# Patient Record
Sex: Male | Born: 1955 | Race: White | Hispanic: No | Marital: Married | Smoking: Former smoker
Health system: Southern US, Community
[De-identification: ages and names within clinical notes are randomized; demographics above are authoritative.]

## PROBLEM LIST (undated history)

## (undated) DIAGNOSIS — M1990 Unspecified osteoarthritis, unspecified site: Secondary | ICD-10-CM

## (undated) HISTORY — DX: Unspecified osteoarthritis, unspecified site: M19.90

---

## 2013-06-11 LAB — HEPATIC FUNCTION PANEL
ALK PHOS: 84 U/L (ref 25–125)
ALT: 25 U/L (ref 10–40)
AST: 30 U/L (ref 14–40)
BILIRUBIN DIRECT: 0.1 mg/dL
Bilirubin, Total: 0.6 mg/dL

## 2013-06-11 LAB — TSH: TSH: 1.03 u[IU]/mL (ref <=5.90)

## 2013-06-11 LAB — BASIC METABOLIC PANEL
BUN: 12 mg/dL (ref 4–21)
CREATININE: 0.9 mg/dL (ref <=1.3)
Potassium: 4.4 mmol/L (ref 3.4–5.3)
SODIUM: 140 mmol/L (ref 137–147)

## 2013-06-11 LAB — CBC AND DIFFERENTIAL
HEMATOCRIT: 47 % (ref 41–53)
Hemoglobin: 16.2 g/dL (ref 13.5–17.5)
Platelets: 257 10*3/uL (ref 150–399)
WBC: 5.8 10^3/mL

## 2013-09-30 LAB — HM COLONOSCOPY

## 2014-10-08 ENCOUNTER — Encounter: Payer: Self-pay | Admitting: Internal Medicine

## 2014-10-08 ENCOUNTER — Ambulatory Visit (INDEPENDENT_AMBULATORY_CARE_PROVIDER_SITE_OTHER): Payer: 59 | Admitting: Family Medicine

## 2014-10-08 VITALS — BP 124/79 | HR 51 | Temp 98.1°F | Resp 16 | Ht 72.8 in | Wt 184.4 lb

## 2014-10-08 DIAGNOSIS — Z0000 Encounter for general adult medical examination without abnormal findings: Secondary | ICD-10-CM

## 2014-10-08 DIAGNOSIS — Z13220 Encounter for screening for lipoid disorders: Secondary | ICD-10-CM | POA: Diagnosis not present

## 2014-10-08 DIAGNOSIS — Z130 Encounter for screening for diseases of the blood and blood-forming organs and certain disorders involving the immune mechanism: Secondary | ICD-10-CM | POA: Diagnosis not present

## 2014-10-08 DIAGNOSIS — Z125 Encounter for screening for malignant neoplasm of prostate: Secondary | ICD-10-CM | POA: Diagnosis not present

## 2014-10-08 DIAGNOSIS — Z23 Encounter for immunization: Secondary | ICD-10-CM

## 2014-10-08 LAB — LIPID PANEL
CHOLESTEROL: 194 mg/dL (ref 0–200)
HDL: 46 mg/dL (ref >=40)
LDL CALC: 119 mg/dL — AB (ref 0–99)
Total CHOL/HDL Ratio: 4.2 Ratio
Triglycerides: 147 mg/dL (ref <150)
VLDL: 29 mg/dL (ref 0–40)

## 2014-10-08 LAB — CBC
HEMATOCRIT: 48.5 % (ref 39.0–52.0)
Hemoglobin: 16.7 g/dL (ref 13.0–17.0)
MCH: 31.5 pg (ref 26.0–34.0)
MCHC: 34.4 g/dL (ref 30.0–36.0)
MCV: 91.3 fL (ref 78.0–100.0)
MPV: 9.4 fL (ref 8.6–12.4)
PLATELETS: 274 10*3/uL (ref 150–400)
RBC: 5.31 MIL/uL (ref 4.22–5.81)
RDW: 13.2 % (ref 11.5–15.5)
WBC: 8.3 10*3/uL (ref 4.0–10.5)

## 2014-10-08 LAB — COMPREHENSIVE METABOLIC PANEL
ALK PHOS: 73 U/L (ref 39–117)
ALT: 22 U/L (ref 0–53)
AST: 24 U/L (ref 0–37)
Albumin: 4.2 g/dL (ref 3.5–5.2)
BUN: 16 mg/dL (ref 6–23)
CALCIUM: 9.3 mg/dL (ref 8.4–10.5)
CHLORIDE: 105 meq/L (ref 96–112)
CO2: 22 mEq/L (ref 19–32)
CREATININE: 0.83 mg/dL (ref 0.50–1.35)
Glucose, Bld: 88 mg/dL (ref 70–99)
Potassium: 4.7 mEq/L (ref 3.5–5.3)
Sodium: 139 mEq/L (ref 135–145)
TOTAL PROTEIN: 6.9 g/dL (ref 6.0–8.3)
Total Bilirubin: 0.7 mg/dL (ref 0.2–1.2)

## 2014-10-08 NOTE — Progress Notes (Signed)
[facility] [address] [place]  [phone]  Date:  10/08/2014   Name:  Jose Hobbs   DOB:  1955/11/07   MRN:  [id number]  PCP:  No primary care provider on file.    Chief Complaint: Annual Exam   History of Present Illness:  Jose Hobbs is a 59 y.o. very pleasant male patient who presents with the following:  Here today for a wellness exam.  Last colon in 2009- was normal He likes to exercise outdoors a good bit.   He is not sure about his last tetanus shot but thinks that he is due  There are no active problems to display for this patient.   Past Medical History  Diagnosis Date  . Arthritis     History reviewed. No pertinent past surgical history.  History  Substance Use Topics  . Smoking status: Never Smoker   . Smokeless tobacco: Not on file  . Alcohol Use: No    History reviewed. No pertinent family history.  No Known Allergies  Medication list has been reviewed and updated.  No current outpatient prescriptions on file prior to visit.   No current facility-administered medications on file prior to visit.    Review of Systems:  As per HPI- otherwise negative. 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 Ideal Body Weight: Weight in (lb) to have BMI = 25: 188.1  GEN: WDWN, NAD, Non-toxic, A & O x 3 HEENT: Atraumatic, Normocephalic. Neck supple. No masses, No LAD.  Bilateral TM wnl, oropharynx normal.  PEERL,EOMI.    Ears and Nose: No external deformity. CV: RRR, No M/G/R. No JVD. No thrill. No extra heart sounds. PULM: CTA B, no wheezes, crackles, rhonchi.  No retractions. No resp. distress. No accessory muscle use. ABD: S, NT, ND. No rebound. No HSM. EXTR: No c/c/e NEURO Normal gait.  PSYCH: Normally interactive. Conversant. Not depressed or anxious appearing.  Calm demeanor.  GU: normal testes, penis and prostate  Assessment and Plan: Immunization due - Plan: Tdap vaccine greater than or equal to 7yo IM  Screening for hyperlipidemia - Plan: Lipid panel  Screening for deficiency anemia - Plan: CBC  Screening for prostate cancer - Plan: PSA  Physical exam - Plan: Comprehensive metabolic panel  tdap today, declines flu shot.  Await labs- Will plan further follow- up pending labs. See patient instructions for more details.    Send rx for zostavax along with labs as I forgot to give this to him today after discussion   Signed Lamar Blinks, MD

## 2014-10-08 NOTE — Patient Instructions (Signed)
Good to see you today- I will be in touch with your labs Continue to exercise and take care of yourself You will be due for a colonoscopy in 2019 You had your Tdap today Be careful of the sun.  If you would like to see ENT about your tinnitus please let me know.   You can also see audiology regarding low hearing if you like; you should not need a referral to see audiology   Td Vaccine (Tetanus and Diphtheria): What You Need to Know 1. Why get vaccinated? Tetanus  and diphtheria are very serious diseases. They are rare in the [place] today, but people who do become infected often have severe complications. Td vaccine is used to protect adolescents and adults from both of these diseases. Both tetanus and diphtheria are infections caused by bacteria. Diphtheria spreads from person to person through coughing or sneezing. Tetanus-causing bacteria enter the body through cuts, scratches, or wounds. TETANUS (Lockjaw) causes painful muscle tightening and stiffness, usually all over the body.  It can lead to tightening of muscles in the head and neck so you can't open your mouth, swallow, or sometimes even breathe. Tetanus kills about 1 out of every 5 people who are infected. DIPHTHERIA can cause a thick coating to form in the back of the throat.  It can lead to breathing problems, paralysis, heart failure, and death. Before vaccines, the [place] saw as many as 200,000 cases a year of diphtheria and hundreds of cases of tetanus. Since vaccination began, cases of both diseases have dropped by about 99%. 2. Td vaccine Td vaccine can protect adolescents and adults from tetanus and diphtheria. Td is usually given as a booster dose every 10 years but it can also be given earlier after a severe and dirty wound or burn. Your doctor can give you more information. Td may safely be given at the same time as other vaccines. 3. Some people should not get this vaccine  If you ever had a life-threatening  allergic reaction after a dose of any tetanus or diphtheria containing vaccine, OR if you have a severe allergy to any part of this vaccine, you should not get Td. Tell your doctor if you have any severe allergies.  Talk to your doctor if you:  have epilepsy or another nervous system problem,  had severe pain or swelling after any vaccine containing diphtheria or tetanus,  ever had Guillain Barr Syndrome (GBS),  aren't feeling well on the day the shot is scheduled. 4. Risks of a vaccine reaction With a vaccine, like any medicine, there is a chance of side effects. These are usually mild and go away on their own. Serious side effects are also possible, but are very rare. Most people who get Td vaccine do not have any problems with it. Mild Problems  following Td (Did not interfere with activities)  Pain where the shot was given (about 8 people in 10)  Redness or swelling where the shot was given (about 1 person in 3)  Mild fever (about 1 person in 15)  Headache or Tiredness (uncommon) Moderate Problems following Td (Interfered with activities, but did not require medical attention)  Fever over 102F (rare) Severe Problems  following Td (Unable to perform usual activities; required medical attention)  Swelling, severe pain, bleeding and/or redness in the arm where the shot was given (rare). Problems that could happen after any vaccine:  Brief fainting spells can happen after any medical procedure, including vaccination. Sitting or lying  down for about 15 minutes can help prevent fainting, and injuries caused by a fall. Tell your doctor if you feel dizzy, or have vision changes or ringing in the ears.  Severe shoulder pain and reduced range of motion in the arm where a shot was given can happen, very rarely, after a vaccination.  Severe allergic reactions from a vaccine are very rare, estimated at less than 1 in a million doses. If one were to occur, it would usually be within a  few minutes to a few hours after the vaccination. 5. What if there is a serious reaction? What should I look for?  Look for anything that concerns you, such as signs of a severe allergic reaction, very high fever, or behavior changes. Signs of a severe allergic reaction can include hives, swelling of the face and throat, difficulty breathing, a fast heartbeat, dizziness, and weakness. These would usually start a few minutes to a few hours after the vaccination. What should I do?  If you think it is a severe allergic reaction or other emergency that can't wait, call 9-1-1 or get the person to the nearest hospital. Otherwise, call your doctor.  Afterward, the reaction should be reported to the Vaccine Adverse Event Reporting System (VAERS). Your doctor might file this report, or you can do it yourself through the VAERS web site at www.vaers.SamedayNews.es, or by calling 272-189-5965. VAERS is only for reporting reactions. They do not give medical advice. 6. The National Vaccine Injury Compensation Program The [employer] Vaccine Injury Compensation Program (VICP) is a federal program that was created to compensate people who may have been injured by certain vaccines. Persons who believe they may have been injured by a vaccine can learn about the program and about filing a claim by calling 8123759101 or visiting the [place] website at GoldCloset.com.ee. 7. How can I learn more?  Ask your doctor.  Contact your local or state health department.  Contact the Centers for Disease Control and Prevention (CDC):  Call 408-704-2130 (1-800-CDC-INFO)  Visit CDC's website at http://hunter.com/ CDC Td Vaccine Interim VIS (08/14/12) Document Released: 04/24/2006 Document Revised: 11/11/2013 Document Reviewed: 10/09/2013 [facility] Patient Information 2015 [place], [place]. This information is not intended to replace advice given to you by your health care provider. Make sure you discuss any  questions you have with your health care provider.

## 2014-10-08 NOTE — Progress Notes (Signed)
   Subjective:    Patient ID: Jose Hobbs, male    DOB: [birth year], 59 y.o.   MRN: [id number]  HPI    Review of Systems  Constitutional: Negative.   HENT: Positive for tinnitus.   Eyes: Negative.   Respiratory: Negative.   Cardiovascular: Negative.   Gastrointestinal: Negative.   Endocrine: Negative.   Genitourinary: Negative.   Musculoskeletal: Positive for arthralgias.  Allergic/Immunologic: Negative.   Neurological: Negative.   Hematological: Negative.   Psychiatric/Behavioral: Negative.        Objective:   Physical Exam        Assessment & Plan:

## 2014-10-09 ENCOUNTER — Encounter: Payer: Self-pay | Admitting: Family Medicine

## 2014-10-09 LAB — PSA: PSA: 0.91 ng/mL (ref <=4.00)

## 2015-06-23 ENCOUNTER — Telehealth: Payer: Self-pay

## 2015-06-23 DIAGNOSIS — D229 Melanocytic nevi, unspecified: Secondary | ICD-10-CM

## 2015-06-23 NOTE — Telephone Encounter (Signed)
Joseph Art states her husband have an appt with Dr Wilhemina Bonito with [facility] on [address] for Thursday and they need a referral. Please call [phone] when done

## 2015-06-23 NOTE — Telephone Encounter (Signed)
[facility] referral. I need diagnosis. Left message for pt to call back.

## 2015-06-23 NOTE — Telephone Encounter (Signed)
Wife called and stated he has a mole on his back and a new one on his chest. Does he need to be seen first?

## 2016-01-21 ENCOUNTER — Encounter: Payer: Self-pay | Admitting: Family Medicine

## 2016-01-21 ENCOUNTER — Ambulatory Visit (INDEPENDENT_AMBULATORY_CARE_PROVIDER_SITE_OTHER): Payer: BLUE CROSS/BLUE SHIELD | Admitting: Family Medicine

## 2016-01-21 DIAGNOSIS — M549 Dorsalgia, unspecified: Secondary | ICD-10-CM

## 2016-01-21 DIAGNOSIS — Z87891 Personal history of nicotine dependence: Secondary | ICD-10-CM

## 2016-01-21 DIAGNOSIS — M1990 Unspecified osteoarthritis, unspecified site: Secondary | ICD-10-CM

## 2016-01-21 DIAGNOSIS — R413 Other amnesia: Secondary | ICD-10-CM | POA: Diagnosis not present

## 2016-01-21 DIAGNOSIS — R5383 Other fatigue: Secondary | ICD-10-CM

## 2016-01-21 DIAGNOSIS — Z8042 Family history of malignant neoplasm of prostate: Secondary | ICD-10-CM | POA: Insufficient documentation

## 2016-01-21 DIAGNOSIS — F17290 Nicotine dependence, other tobacco product, uncomplicated: Secondary | ICD-10-CM | POA: Insufficient documentation

## 2016-01-21 DIAGNOSIS — G8929 Other chronic pain: Secondary | ICD-10-CM

## 2016-01-21 DIAGNOSIS — Z720 Tobacco use: Secondary | ICD-10-CM | POA: Diagnosis not present

## 2016-01-21 NOTE — Progress Notes (Signed)
Marjory Sneddon, D.O. Primary care at [facility]:    Chief Complaint  Patient presents with  . Establish Care   New pt, here to establish care.   HPI: Jose Hobbs is a pleasant 60 y.o. male who presents to [place] [facility] today to establish care.    Patient has never had a primary care physician. 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 He is married and has no children.  Bothersome to patient that his father had metastatic prostate cancer. Both parents suffer from depression and high blood pressure. Father had a stroke and suffered memory problems b/c of that.  Pt worried about his own memory.    Has h/o of chronic back pain. He's had 6 or 7 flareups since his 30s of back pain. He has never sought any professional help through physical therapy or other.  - stable and esentially does his ADLs.  Occsnal flares.  takes no medications. Would like to know exercses he can do to improve pain / prevent further flares etc.   smokes cigars. Would like to eventually stop. Patient started smoking when he was 60 years old and smoked 1 pack of cigarettes a day until 2013. He quit cigarettes but then picked up cigars and smokes at least 2-3 per day. He also chews Nicorette gum approximately 3 pieces per day.    Past Medical History  Diagnosis Date  . Arthritis     History reviewed. No pertinent past surgical history.   Family History  Problem Relation Age of Onset  . Depression Mother   . Hypertension Mother   . Cancer Father     metastatic prostate  . Depression Father   . Hypertension Father   . Stroke Father   . Healthy Brother     History  Drug Use No     History  Alcohol Use No     History  Smoking status  . Former Smoker  . Quit date: 07/12/2011  Smokeless tobacco  . Never Used      History  Sexual Activity  . Sexual Activity: Yes  . Birth Control/ Protection: None      Patient's Medications   No medications on file     Review of patient's allergies indicates no known allergies.   Immunization History  Administered Date(s) Administered  . Tdap 10/08/2014    Review of Systems:   ( Completed via Adult Medical History Intake form today ) General:   Denies fever, chills, appetite changes, unexplained weight loss.  Optho/Auditory:   Denies visual changes, blurred vision/LOV, ringing in ears/ diff hearing Respiratory:   Denies SOB, DOE, cough, wheezing.  Cardiovascular:   Denies chest pain, palpitations, new onset peripheral edema  Gastrointestinal:   Denies nausea, vomiting, diarrhea.  Genitourinary:    Denies dysuria, increased frequency, flank pain.  Endocrine:     Denies hot or cold intolerance, polyuria, polydipsia. Musculoskeletal:  Denies unexplained myalgias, joint swelling, arthralgias, gait problems.  Skin:  Denies rash, suspicious lesions or new/ changes in moles Neurological:    Denies dizziness, syncope, unexplained weakness, lightheadedness, numbness  Psychiatric/Behavioral:   Denies mood changes, suicidal or homicidal ideations, hallucinations    Objective:   Blood pressure 131/81, pulse 54, height 5' 11.5" (1.816 m), weight 184 lb 6.4 oz (83.643 kg). Body mass index is 25.36 kg/(m^2).  General: Well Developed, well nourished, and in no acute distress.  Neuro: Alert and oriented  x3, extra-ocular muscles intact, sensation grossly intact.  HEENT: Normocephalic, atraumatic, pupils equal round reactive to light, neck supple, no gross masses, no carotid bruits, no JVD apprec Skin: no gross suspicious lesions or rashes  Cardiac: Regular rate and rhythm, no murmurs rubs or gallops.  Respiratory: inc exh phase and distant but essentially clear to auscultation bilaterally. Not using accessory muscles, speaking in full sentences.  Abdominal: Soft, not grossly distended Musculoskeletal: Ambulates w/o diff, FROM * 4 ext.  Vasc: less 2 sec cap RF, warm and pink  Psych:   No HI/SI, judgement and insight good.    Impression and Recommendations:    1. Chronic back pain greater than 3 months duration   2. History of tobacco abuse   3. Cigar smoker   4. fatigue- generalized.   5. Family history of prostate cancer in father   61. Pt with concerns of Memory dysfunction   7. Arthritis- lower back   -Patient declined physical therapy referral. Take NSAIDs at first onset when patient starts to hurt. Gave him handouts and showed him in the office had to do some stretches. Core strengthening his key and I recommend planks daily. Handouts provided.  -Patient not ready to quit smoking yet but would like to consider in the future.   -we will obtain labs, then address issues as they present. Pt understands need to become healthier and cannot ignore it any longer  The patient was counseled, risk factors were discussed, anticipatory guidance given.  Please see AVS handed out to patient at the end of our visit for further patient instructions/ counseling done pertaining to today's office visit.    Orders Placed This Encounter  Procedures  . CBC with Differential/Platelet  . COMPLETE METABOLIC PANEL WITH GFR  . Hemoglobin A1c  . Lipid panel    Order Specific Question:  Has the patient fasted?    Answer:  Yes  . TSH  . VITAMIN D 25 Hydroxy (Vit-D Deficiency, Fractures)  . T3, free  . T4, free  . Vitamin B12  . PSA   Note: This document was prepared using Dragon voice recognition software and may include unintentional dictation errors.

## 2016-01-21 NOTE — Patient Instructions (Signed)
Do planks daily-    Back Exercises The following exercises strengthen the muscles that help to support the back. They also help to keep the lower back flexible. Doing these exercises can help to prevent back pain or lessen existing pain. If you have back pain or discomfort, try doing these exercises 2-3 times each day or as told by your health care provider. When the pain goes away, do them once each day, but increase the number of times that you repeat the steps for each exercise (do more repetitions). If you do not have back pain or discomfort, do these exercises once each day or as told by your health care provider. EXERCISES Single Knee to Chest Repeat these steps 3-5 times for each leg: 1. Lie on your back on a firm bed or the floor with your legs extended. 2. Bring one knee to your chest. Your other leg should stay extended and in contact with the floor. 3. Hold your knee in place by grabbing your knee or thigh. 4. Pull on your knee until you feel a gentle stretch in your lower back. 5. Hold the stretch for 10-30 seconds. 6. Slowly release and straighten your leg. Pelvic Tilt Repeat these steps 5-10 times: 1. Lie on your back on a firm bed or the floor with your legs extended. 2. Bend your knees so they are pointing toward the ceiling and your feet are flat on the floor. 3. Tighten your lower abdominal muscles to press your lower back against the floor. This motion will tilt your pelvis so your tailbone points up toward the ceiling instead of pointing to your feet or the floor. 4. With gentle tension and even breathing, hold this position for 5-10 seconds. Cat-Cow Repeat these steps until your lower back becomes more flexible: 1. Get into a hands-and-knees position on a firm surface. Keep your hands under your shoulders, and keep your knees under your hips. You may place padding under your knees for comfort. 2. Let your head hang down, and point your tailbone toward the floor so your  lower back becomes rounded like the back of a cat. 3. Hold this position for 5 seconds. 4. Slowly lift your head and point your tailbone up toward the ceiling so your back forms a sagging arch like the back of a cow. 5. Hold this position for 5 seconds. Press-Ups Repeat these steps 5-10 times: 1. Lie on your abdomen (face-down) on the floor. 2. Place your palms near your head, about shoulder-width apart. 3. While you keep your back as relaxed as possible and keep your hips on the floor, slowly straighten your arms to raise the top half of your body and lift your shoulders. Do not use your back muscles to raise your upper torso. You may adjust the placement of your hands to make yourself more comfortable. 4. Hold this position for 5 seconds while you keep your back relaxed. 5. Slowly return to lying flat on the floor. Bridges Repeat these steps 10 times: 1. Lie on your back on a firm surface. 2. Bend your knees so they are pointing toward the ceiling and your feet are flat on the floor. 3. Tighten your buttocks muscles and lift your buttocks off of the floor until your waist is at almost the same height as your knees. You should feel the muscles working in your buttocks and the back of your thighs. If you do not feel these muscles, slide your feet 1-2 inches farther away from your buttocks.  4. Hold this position for 3-5 seconds. 5. Slowly lower your hips to the starting position, and allow your buttocks muscles to relax completely. If this exercise is too easy, try doing it with your arms crossed over your chest. Abdominal Crunches Repeat these steps 5-10 times: 1. Lie on your back on a firm bed or the floor with your legs extended. 2. Bend your knees so they are pointing toward the ceiling and your feet are flat on the floor. 3. Cross your arms over your chest. 4. Tip your chin slightly toward your chest without bending your neck. 5. Tighten your abdominal muscles and slowly raise your trunk  (torso) high enough to lift your shoulder blades a tiny bit off of the floor. Avoid raising your torso higher than that, because it can put too much stress on your low back and it does not help to strengthen your abdominal muscles. 6. Slowly return to your starting position. Back Lifts Repeat these steps 5-10 times: 1. Lie on your abdomen (face-down) with your arms at your sides, and rest your forehead on the floor. 2. Tighten the muscles in your legs and your buttocks. 3. Slowly lift your chest off of the floor while you keep your hips pressed to the floor. Keep the back of your head in line with the curve in your back. Your eyes should be looking at the floor. 4. Hold this position for 3-5 seconds. 5. Slowly return to your starting position. SEEK MEDICAL CARE IF:  Your back pain or discomfort gets much worse when you do an exercise.  Your back pain or discomfort does not lessen within 2 hours after you exercise. If you have any of these problems, stop doing these exercises right away. Do not do them again unless your health care provider says that you can. SEEK IMMEDIATE MEDICAL CARE IF:  You develop sudden, severe back pain. If this happens, stop doing the exercises right away. Do not do them again unless your health care provider says that you can.   This information is not intended to replace advice given to you by your health care provider. Make sure you discuss any questions you have with your health care provider.   Document Released: 08/04/2004 Document Revised: 03/18/2015 Document Reviewed: 08/21/2014 Elsevier Interactive Patient Education 2016 [place] Injury Prevention Back injuries can be very painful. They can also be difficult to heal. After having one back injury, you are more likely to injure your back again. It is important to learn how to avoid injuring or re-injuring your back. The following tips can help you to prevent a back injury. WHAT SHOULD I KNOW ABOUT  PHYSICAL FITNESS? 7. Exercise for 30 minutes per day on most days of the week or as directed by your health care provider. Make sure to: 1. Do aerobic exercises, such as walking, jogging, biking, or swimming. 2. Do exercises that increase balance and strength, such as tai chi and yoga. These can decrease your risk of falling and injuring your back. 3. Do stretching exercises to help with flexibility. 4. Try to develop strong abdominal muscles. Your abdominal muscles provide a lot of the support that is needed by your back. 8. Maintain a healthy weight. This helps to decrease your risk of a back injury. WHAT SHOULD I KNOW ABOUT MY DIET? 5. Talk with your health care provider about your overall diet. Take supplements and vitamins only as directed by your health care provider. 6. Talk with your health care provider  about how much calcium and vitamin D you need each day. These nutrients help to prevent weakening of the bones (osteoporosis). Osteoporosis can cause broken (fractured) bones, which lead to back pain. 7. Include good sources of calcium in your diet, such as dairy products, green leafy vegetables, and products that have had calcium added to them (fortified). 8. Include good sources of vitamin D in your diet, such as milk and foods that are fortified with vitamin D. WHAT SHOULD I KNOW ABOUT MY POSTURE? 6. Sit up straight and stand up straight. Avoid leaning forward when you sit or hunching over when you stand. 7. Choose chairs that have good low-back (lumbar) support. 8. If you work at a desk, sit close to it so you do not need to lean over. Keep your chin tucked in. Keep your neck drawn back, and keep your elbows bent at a right angle. Your arms should look like the letter "L." 9. Sit high and close to the steering wheel when you drive. Add a lumbar support to your car seat, if needed. 10. Avoid sitting or standing in one position for very long. Take breaks to get up, stretch, and walk  around at least one time every hour. Take breaks every hour if you are driving for long periods of time. 11. Sleep on your side with your knees slightly bent, or sleep on your back with a pillow under your knees. Do not lie on the front of your body to sleep. WHAT SHOULD I KNOW ABOUT LIFTING, TWISTING, AND REACHING? Lifting and Heavy Lifting 6. Avoid heavy lifting, especially repetitive heavy lifting. If you must do heavy lifting: 1. Stretch before lifting. 2. Work slowly. 3. Rest between lifts. 4. Use a tool such as a cart or a dolly to move objects if one is available. 5. Make several small trips instead of carrying one heavy load. 6. Ask for help when you need it, especially when moving big objects. 7. Follow these steps when lifting: 1. Stand with your feet shoulder-width apart. 2. Get as close to the object as you can. Do not try to pick up a heavy object that is far from your body. 3. Use handles or lifting straps if they are available. 4. Bend at your knees. Squat down, but keep your heels off the floor. 5. Keep your shoulders pulled back, your chin tucked in, and your back straight. 6. Lift the object slowly while you tighten the muscles in your legs, abdomen, and buttocks. Keep the object as close to the center of your body as possible. 8. Follow these steps when putting down a heavy load: 1. Stand with your feet shoulder-width apart. 2. Lower the object slowly while you tighten the muscles in your legs, abdomen, and buttocks. Keep the object as close to the center of your body as possible. 3. Keep your shoulders pulled back, your chin tucked in, and your back straight. 4. Bend at your knees. Squat down, but keep your heels off the floor. 5. Use handles or lifting straps if they are available. Twisting and Reaching 6. Avoid lifting heavy objects above your waist. 7. Do not twist at your waist while you are lifting or carrying a load. If you need to turn, move your feet. 8. Do not  bend over without bending at your knees. 9. Avoid reaching over your head, across a table, or for an object on a high surface. WHAT ARE SOME OTHER TIPS? 7. Avoid wet floors and icy ground. Keep sidewalks  clear of ice to prevent falls. 8. Do not sleep on a mattress that is too soft or too hard. 9. Keep items that are used frequently within easy reach. 10. Put heavier objects on shelves at waist level, and put lighter objects on lower or higher shelves. 11. Find ways to decrease your stress, such as exercise, massage, or relaxation techniques. Stress can build up in your muscles. Tense muscles are more vulnerable to injury. 12. Talk with your health care provider if you feel anxious or depressed. These conditions can make back pain worse. 13. Wear flat heel shoes with cushioned soles. 14. Avoid sudden movements. 15. Use both shoulder straps when carrying a backpack. 16. Do not use any tobacco products, including cigarettes, chewing tobacco, or electronic cigarettes. If you need help quitting, ask your health care provider.   This information is not intended to replace advice given to you by your health care provider. Make sure you discuss any questions you have with your health care provider.   Document Released: 08/04/2004 Document Revised: 11/11/2014 Document Reviewed: 07/01/2014 Elsevier Interactive Patient Education [employer].

## 2016-01-22 LAB — LIPID PANEL
CHOL/HDL RATIO: 3.7 ratio (ref <=5.0)
CHOLESTEROL: 176 mg/dL (ref 125–200)
HDL: 48 mg/dL (ref >=40)
LDL Cholesterol: 105 mg/dL (ref <130)
TRIGLYCERIDES: 113 mg/dL (ref <150)
VLDL: 23 mg/dL (ref <30)

## 2016-01-22 LAB — COMPLETE METABOLIC PANEL WITH GFR
ALT: 17 U/L (ref 9–46)
AST: 22 U/L (ref 10–35)
Albumin: 4.4 g/dL (ref 3.6–5.1)
Alkaline Phosphatase: 82 U/L (ref 40–115)
BUN: 14 mg/dL (ref 7–25)
CHLORIDE: 104 mmol/L (ref 98–110)
CO2: 28 mmol/L (ref 20–31)
Calcium: 9.6 mg/dL (ref 8.6–10.3)
Creat: 0.98 mg/dL (ref 0.70–1.33)
GFR, EST NON AFRICAN AMERICAN: 84 mL/min (ref >=60)
GFR, Est African American: >89 mL/min (ref >=60)
Glucose, Bld: 97 mg/dL (ref 65–99)
POTASSIUM: 5.7 mmol/L — AB (ref 3.5–5.3)
SODIUM: 139 mmol/L (ref 135–146)
Total Bilirubin: 0.6 mg/dL (ref 0.2–1.2)
Total Protein: 6.8 g/dL (ref 6.1–8.1)

## 2016-01-22 LAB — CBC WITH DIFFERENTIAL/PLATELET
Basophils Absolute: 0 cells/uL (ref 0–200)
Basophils Relative: 0 %
EOS PCT: 3 %
Eosinophils Absolute: 207 cells/uL (ref 15–500)
HCT: 48.2 % (ref 38.5–50.0)
Hemoglobin: 16.1 g/dL (ref 13.2–17.1)
LYMPHS ABS: 1863 {cells}/uL (ref 850–3900)
Lymphocytes Relative: 27 %
MCH: 30.9 pg (ref 27.0–33.0)
MCHC: 33.4 g/dL (ref 32.0–36.0)
MCV: 92.5 fL (ref 80.0–100.0)
MPV: 10.3 fL (ref 7.5–12.5)
Monocytes Absolute: 552 cells/uL (ref 200–950)
Monocytes Relative: 8 %
NEUTROS ABS: 4278 {cells}/uL (ref 1500–7800)
NEUTROS PCT: 62 %
PLATELETS: 242 10*3/uL (ref 140–400)
RBC: 5.21 MIL/uL (ref 4.20–5.80)
RDW: 13.1 % (ref 11.0–15.0)
WBC: 6.9 10*3/uL (ref 3.8–10.8)

## 2016-01-22 LAB — PSA: PSA: 1.03 ng/mL (ref <=4.00)

## 2016-01-22 LAB — VITAMIN D 25 HYDROXY (VIT D DEFICIENCY, FRACTURES): Vit D, 25-Hydroxy: 35 ng/mL (ref 30–100)

## 2016-01-22 LAB — T4, FREE: FREE T4: 1.2 ng/dL (ref 0.8–1.8)

## 2016-01-22 LAB — HEMOGLOBIN A1C
Hgb A1c MFr Bld: 5.7 % — ABNORMAL HIGH (ref <5.7)
Mean Plasma Glucose: 117 mg/dL

## 2016-01-22 LAB — T3, FREE: T3, Free: 3.4 pg/mL (ref 2.3–4.2)

## 2016-01-22 LAB — VITAMIN B12: VITAMIN B 12: 569 pg/mL (ref 200–1100)

## 2016-01-22 LAB — TSH: TSH: 0.84 mIU/L (ref 0.40–4.50)

## 2016-01-26 NOTE — Progress Notes (Signed)
Quick Note:  We will discuss these lab results at office visit tomorrow ______

## 2016-01-27 ENCOUNTER — Ambulatory Visit (INDEPENDENT_AMBULATORY_CARE_PROVIDER_SITE_OTHER): Payer: BLUE CROSS/BLUE SHIELD | Admitting: Family Medicine

## 2016-01-27 ENCOUNTER — Encounter: Payer: Self-pay | Admitting: Family Medicine

## 2016-01-27 VITALS — BP 142/80 | HR 61 | Ht 71.5 in | Wt 186.0 lb

## 2016-01-27 DIAGNOSIS — Z720 Tobacco use: Secondary | ICD-10-CM | POA: Diagnosis not present

## 2016-01-27 DIAGNOSIS — Z8042 Family history of malignant neoplasm of prostate: Secondary | ICD-10-CM | POA: Diagnosis not present

## 2016-01-27 DIAGNOSIS — F17290 Nicotine dependence, other tobacco product, uncomplicated: Secondary | ICD-10-CM

## 2016-01-27 DIAGNOSIS — Z87891 Personal history of nicotine dependence: Secondary | ICD-10-CM | POA: Diagnosis not present

## 2016-01-27 DIAGNOSIS — R7303 Prediabetes: Secondary | ICD-10-CM

## 2016-01-27 DIAGNOSIS — M1990 Unspecified osteoarthritis, unspecified site: Secondary | ICD-10-CM | POA: Insufficient documentation

## 2016-01-27 DIAGNOSIS — R5383 Other fatigue: Secondary | ICD-10-CM

## 2016-01-27 NOTE — Assessment & Plan Note (Addendum)
  The patient was counseled on the dangers of tobacco/cigar use, and was advised to quit, esp due to his long h/o cig smoking as well.   Reviewed strategies to maximize success, including removing cigarettes and smoking materials from environment, stress management, substitution of other forms of reinforcement and pharmacotherapy (wellbutrin, chatix, patches/ gums).  Patient wishes to do it on his own. Doesn't think he needs any supplements or other medications.

## 2016-01-27 NOTE — Assessment & Plan Note (Signed)
Patient reassured by PSA level but understands I recommend yearly prostate exams as well.

## 2016-01-27 NOTE — Assessment & Plan Note (Signed)
Patient feels reassured by lab values.

## 2016-01-27 NOTE — Patient Instructions (Addendum)
Try to cut back, then off, cigars  Gave patient a separate handout on prediabetes and how to prevent further progression to diabetes after long discussion     Smoking Cessation, Tips for Success If you are ready to quit smoking, congratulations! You have chosen to help yourself be healthier. Cigarettes bring nicotine, tar, carbon monoxide, and other irritants into your body. Your lungs, heart, and blood vessels will be able to work better without these poisons. There are many different ways to quit smoking. Nicotine gum, nicotine patches, a nicotine inhaler, or nicotine nasal spray can help with physical craving. Hypnosis, support groups, and medicines help break the habit of smoking. WHAT THINGS CAN I DO TO MAKE QUITTING EASIER?  Here are some tips to help you quit for good:  Pick a date when you will quit smoking completely. Tell all of your friends and family about your plan to quit on that date.  Do not try to slowly cut down on the number of cigarettes you are smoking. Pick a quit date and quit smoking completely starting on that day.  Throw away all cigarettes.   Clean and remove all ashtrays from your home, work, and car.  On a card, write down your reasons for quitting. Carry the card with you and read it when you get the urge to smoke.  Cleanse your body of nicotine. Drink enough water and fluids to keep your urine clear or pale yellow. Do this after quitting to flush the nicotine from your body.  Learn to predict your moods. Do not let a bad situation be your excuse to have a cigarette. Some situations in your life might tempt you into wanting a cigarette.  Never have "just one" cigarette. It leads to wanting another and another. Remind yourself of your decision to quit.  Change habits associated with smoking. If you smoked while driving or when feeling stressed, try other activities to replace smoking. Stand up when drinking your coffee. Brush your teeth after eating. Sit in  a different chair when you read the paper. Avoid alcohol while trying to quit, and try to drink fewer caffeinated beverages. Alcohol and caffeine may urge you to smoke.  Avoid foods and drinks that can trigger a desire to smoke, such as sugary or spicy foods and alcohol.  Ask people who smoke not to smoke around you.  Have something planned to do right after eating or having a cup of coffee. For example, plan to take a walk or exercise.  Try a relaxation exercise to calm you down and decrease your stress. Remember, you may be tense and nervous for the first 2 weeks after you quit, but this will pass.  Find new activities to keep your hands busy. Play with a pen, coin, or rubber band. Doodle or draw things on paper.  Brush your teeth right after eating. This will help cut down on the craving for the taste of tobacco after meals. You can also try mouthwash.   Use oral substitutes in place of cigarettes. Try using lemon drops, carrots, cinnamon sticks, or chewing gum. Keep them handy so they are available when you have the urge to smoke.  When you have the urge to smoke, try deep breathing.  Designate your home as a nonsmoking area.  If you are a heavy smoker, ask your health care provider about a prescription for nicotine chewing gum. It can ease your withdrawal from nicotine.  Reward yourself. Set aside the cigarette money you save and buy  yourself something nice.  Look for support from others. Join a support group or smoking cessation program. Ask someone at home or at work to help you with your plan to quit smoking.  Always ask yourself, "Do I need this cigarette or is this just a reflex?" Tell yourself, "Today, I choose not to smoke," or "I do not want to smoke." You are reminding yourself of your decision to quit.  Do not replace cigarette smoking with electronic cigarettes (commonly called e-cigarettes). The safety of e-cigarettes is unknown, and some may contain harmful  chemicals.  If you relapse, do not give up! Plan ahead and think about what you will do the next time you get the urge to smoke. HOW WILL I FEEL WHEN I QUIT SMOKING? You may have symptoms of withdrawal because your body is used to nicotine (the addictive substance in cigarettes). You may crave cigarettes, be irritable, feel very hungry, cough often, get headaches, or have difficulty concentrating. The withdrawal symptoms are only temporary. They are strongest when you first quit but will go away within 10-14 days. When withdrawal symptoms occur, stay in control. Think about your reasons for quitting. Remind yourself that these are signs that your body is healing and getting used to being without cigarettes. Remember that withdrawal symptoms are easier to treat than the major diseases that smoking can cause.  Even after the withdrawal is over, expect periodic urges to smoke. However, these cravings are generally short lived and will go away whether you smoke or not. Do not smoke! WHAT RESOURCES ARE AVAILABLE TO HELP ME QUIT SMOKING? Your health care provider can direct you to community resources or hospitals for support, which may include:  Group support.  Education.  Hypnosis.  Therapy.   This information is not intended to replace advice given to you by your health care provider. Make sure you discuss any questions you have with your health care provider.   Document Released: 03/25/2004 Document Revised: 07/18/2014 Document Reviewed: 12/13/2012 Elsevier Interactive Patient Education [employer].

## 2016-01-27 NOTE — Assessment & Plan Note (Signed)
Routine counseling done re: condition, txmnt options of various lifestyle management options and need for follow up in 6 months for repeat

## 2016-01-27 NOTE — Progress Notes (Signed)
Subjective:    Chief Complaint  Patient presents with  . Results    HPI: Jose Hobbs is a 60 y.o. male who presents to [place] at [facility] today to review labs.   He has no complaints but questions about the labs and what they mean and their impact on his health.       Past Medical History  Diagnosis Date  . Arthritis      History reviewed. No pertinent past surgical history.     Family History  Problem Relation Age of Onset  . Depression Mother   . Hypertension Mother   . Cancer Father     metastatic prostate  . Depression Father   . Hypertension Father   . Stroke Father   . Healthy Brother      History  Drug Use No  ,  History  Alcohol Use No  ,  History  Smoking status  . Former Smoker  . Quit date: 07/12/2011  Smokeless tobacco  . Never Used  Smokes cigars - 2-3 per day.      History  Sexual Activity  . Sexual Activity: Yes  . Birth Web designer: None    No Known Allergies    Review of Systems:  ( Completed via adult medical history intake form today ) General:  Denies fever, chills, appetite changes, unexplained weight loss.  Respiratory: Denies SOB, DOE, cough, wheezing.  Cardiovascular: Denies chest pain, palpitations.  Gastrointestinal: Denies nausea, vomiting, diarrhea, abdominal pain.  Genitourinary: Denies dysuria, increased frequency, flank pain. Endocrine: Denies hot or cold intolerance, polyuria, polydipsia. Musculoskeletal: Denies myalgias, back pain, joint swelling, arthralgias, gait problems.  Skin: Denies pallor, rash, suspicious lesions.  Neurological: Denies dizziness, seizures, syncope, unexplained weakness, lightheadedness, numbness and headaches.  Psychiatric/Behavioral: Denies mood changes, suicidal or homicidal ideations, hallucinations, sleep disturbances.    Objective:    Blood pressure 142/80, pulse 61, height 5' 11.5" (1.816 m), weight 186 lb (84.369 kg). Body mass  index is 25.58 kg/(m^2). General: Well Developed, well nourished, and in no acute distress.  HEENT: Normocephalic, atraumatic, pupils equal round reactive to light Skin: Warm and dry, cap RF less 2 sec Cardiac: Regular rate and rhythm, S1, S2 WNL's Respiratory: ECTA B/L, Not using accessory muscles, speaking in full sentences. NeuroM-Sk: Ambulates w/o assistance, moves ext * 4 w/o difficulty, sensation grossly intact.  Psych: A and O *3, judgement and insight good.       Recent Results (from the past 2160 hour(s))  CBC with Differential/Platelet     Status: None   Collection Time: 01/21/16 10:10 AM  Result Value Ref Range   WBC 6.9 3.8 - 10.8 K/uL   RBC 5.21 4.20 - 5.80 MIL/uL   Hemoglobin 16.1 13.2 - 17.1 g/dL   HCT 48.2 38.5 - 50.0 %   MCV 92.5 80.0 - 100.0 fL   MCH 30.9 27.0 - 33.0 pg   MCHC 33.4 32.0 - 36.0 g/dL   RDW 13.1 11.0 - 15.0 %   Platelets 242 140 - 400 K/uL   MPV 10.3 7.5 - 12.5 fL   Neutro Abs 4278 1500 - 7800 cells/uL   Lymphs Abs 1863 850 - 3900 cells/uL   Monocytes Absolute 552 200 - 950 cells/uL   Eosinophils Absolute 207 15 - 500 cells/uL   Basophils Absolute 0 0 - 200 cells/uL   Neutrophils Relative % 62 %   Lymphocytes Relative 27 %   Monocytes Relative 8 %  Eosinophils Relative 3 %   Basophils Relative 0 %   Smear Review Criteria for review not met     Comment: ** Please note change in unit of measure and reference range(s). **  COMPLETE METABOLIC PANEL WITH GFR     Status: Abnormal   Collection Time: 01/21/16 10:10 AM  Result Value Ref Range   Sodium 139 135 - 146 mmol/L   Potassium 5.7 (H) 3.5 - 5.3 mmol/L    Comment: No visible hemolysis.   Chloride 104 98 - 110 mmol/L   CO2 28 20 - 31 mmol/L   Glucose, Bld 97 65 - 99 mg/dL   BUN 14 7 - 25 mg/dL   Creat 0.98 0.70 - 1.33 mg/dL    Comment:   For patients > or = 59 years of age: The upper reference limit for Creatinine is approximately 13% higher for people identified as African-American.       Total Bilirubin 0.6 0.2 - 1.2 mg/dL   Alkaline Phosphatase 82 40 - 115 U/L   AST 22 10 - 35 U/L   ALT 17 9 - 46 U/L   Total Protein 6.8 6.1 - 8.1 g/dL   Albumin 4.4 3.6 - 5.1 g/dL   Calcium 9.6 8.6 - 10.3 mg/dL   GFR, Est African American >89 >=60 mL/min   GFR, Est Non African American 84 >=60 mL/min  Hemoglobin A1c     Status: Abnormal   Collection Time: 01/21/16 10:10 AM  Result Value Ref Range   Hgb A1c MFr Bld 5.7 (H) <5.7 %    Comment:   For someone without known diabetes, a hemoglobin A1c value between 5.7% and 6.4% is consistent with prediabetes and should be confirmed with a follow-up test.   For someone with known diabetes, a value <7% indicates that their diabetes is well controlled. A1c targets should be individualized based on duration of diabetes, age, co-morbid conditions and other considerations.   This assay result is consistent with an increased risk of diabetes.   Currently, no consensus exists regarding use of hemoglobin A1c for diagnosis of diabetes in children.      Mean Plasma Glucose 117 mg/dL  Lipid panel     Status: None   Collection Time: 01/21/16 10:10 AM  Result Value Ref Range   Cholesterol 176 125 - 200 mg/dL   Triglycerides 113 <150 mg/dL   HDL 48 >=40 mg/dL   Total CHOL/HDL Ratio 3.7 <=5.0 Ratio   VLDL 23 <30 mg/dL   LDL Cholesterol 105 <130 mg/dL    Comment:   Total Cholesterol/HDL Ratio:CHD Risk                        Coronary Heart Disease Risk Table                                        Men       Women          1/2 Average Risk              3.4        3.3              Average Risk              5.0        4.4  2X Average Risk              9.6        7.1           3X Average Risk             23.4       11.0 Use the calculated Patient Ratio above and the CHD Risk table  to determine the patient's CHD Risk.   TSH     Status: None   Collection Time: 01/21/16 10:10 AM  Result Value Ref Range   TSH 0.84 0.40 - 4.50  mIU/L  VITAMIN D 25 Hydroxy (Vit-D Deficiency, Fractures)     Status: None   Collection Time: 01/21/16 10:10 AM  Result Value Ref Range   Vit D, 25-Hydroxy 35 30 - 100 ng/mL    Comment: Vitamin D Status           25-OH Vitamin D        Deficiency                <20 ng/mL        Insufficiency         20 - 29 ng/mL        Optimal             > or = 30 ng/mL   For 25-OH Vitamin D testing on patients on D2-supplementation and patients for whom quantitation of D2 and D3 fractions is required, the QuestAssureD 25-OH VIT D, (D2,D3), LC/MS/MS is recommended: order code 36371 (patients > 2 yrs).   T3, free     Status: None   Collection Time: 01/21/16 10:10 AM  Result Value Ref Range   T3, Free 3.4 2.3 - 4.2 pg/mL  T4, free     Status: None   Collection Time: 01/21/16 10:10 AM  Result Value Ref Range   Free T4 1.2 0.8 - 1.8 ng/dL  Vitamin Y27     Status: None   Collection Time: 01/21/16 10:10 AM  Result Value Ref Range   Vitamin B-12 569 200 - 1100 pg/mL  PSA     Status: None   Collection Time: 01/21/16 10:10 AM  Result Value Ref Range   PSA 1.03 <=4.00 ng/mL    Comment: Test Methodology: ECLIA PSA (Electrochemiluminescence Immunoassay)   For PSA values from 2.5-4.0, particularly in younger men <80 years old, the AUA and NCCN suggest testing for % Free PSA (3515) and evaluation of the rate of increase in PSA (PSA velocity).         Impression and Recommendations:    The patient was counselled, risk factors were discussed, anticipatory guidance given.     Cigar smoker  The patient was counseled on the dangers of tobacco/cigar use, and was advised to quit, esp due to his long h/o cig smoking as well.   Reviewed strategies to maximize success, including removing cigarettes and smoking materials from environment, stress management, substitution of other forms of reinforcement and pharmacotherapy (wellbutrin, chatix, patches/ gums).  Patient wishes to do it on his own. Doesn't  think he needs any supplements or other medications.  mild fatigue Patient feels reassured by lab values.  Family history of prostate cancer in father Patient reassured by PSA level but understands I recommend yearly prostate exams as well.  Prediabetes Routine counseling done re: condition, txmnt options of various lifestyle management options and need for follow up in 6 months for repeat    Please see AVS  handed out to patient at the end of our visit for further patient instructions/ counseling done pertaining to today's office visit.  Gross side effects, risk and benefits, and alternatives of medications discussed with patient.  Patient is aware that all medications have potential side effects and we are unable to predict every sideeffect or drug-drug interaction that may occur.  Expresses verbal understanding and consents to current therapy plan and treatment regiment.  Note: This document was prepared using Dragon voice recognition software and may include unintentional dictation errors.

## 2016-01-30 DIAGNOSIS — G8929 Other chronic pain: Secondary | ICD-10-CM | POA: Insufficient documentation

## 2016-01-30 DIAGNOSIS — M549 Dorsalgia, unspecified: Principal | ICD-10-CM

## 2017-01-31 ENCOUNTER — Ambulatory Visit (INDEPENDENT_AMBULATORY_CARE_PROVIDER_SITE_OTHER): Payer: BLUE CROSS/BLUE SHIELD | Admitting: Family Medicine

## 2017-01-31 ENCOUNTER — Encounter: Payer: Self-pay | Admitting: Family Medicine

## 2017-01-31 VITALS — BP 152/85 | HR 62 | Ht 71.0 in | Wt 182.0 lb

## 2017-01-31 DIAGNOSIS — F17290 Nicotine dependence, other tobacco product, uncomplicated: Secondary | ICD-10-CM

## 2017-01-31 DIAGNOSIS — Z8042 Family history of malignant neoplasm of prostate: Secondary | ICD-10-CM

## 2017-01-31 DIAGNOSIS — Z0000 Encounter for general adult medical examination without abnormal findings: Secondary | ICD-10-CM

## 2017-01-31 DIAGNOSIS — Z87891 Personal history of nicotine dependence: Secondary | ICD-10-CM

## 2017-01-31 DIAGNOSIS — Z719 Counseling, unspecified: Secondary | ICD-10-CM

## 2017-01-31 DIAGNOSIS — Z1389 Encounter for screening for other disorder: Secondary | ICD-10-CM

## 2017-01-31 DIAGNOSIS — R7303 Prediabetes: Secondary | ICD-10-CM

## 2017-01-31 DIAGNOSIS — R5383 Other fatigue: Secondary | ICD-10-CM

## 2017-01-31 NOTE — Patient Instructions (Signed)
Give pt home stool cards  Go to Derm for skin screening per your own preference  Go for CT scan- lung ca screening  F/up very near future for FBW- make appt for this and a f/up in 1 mo  [place] as well    Preventive Care for Adults, Male A healthy lifestyle and preventive care can promote health and wellness. Preventive health guidelines for men include the following key practices:  A routine yearly physical is a good way to check with your health care provider about your health and preventative screening. It is a chance to share any concerns and updates on your health and to receive a thorough exam.  Visit your dentist for a routine exam and preventative care every 6 months. Brush your teeth twice a day and floss once a day. Good oral hygiene prevents tooth decay and gum disease.  The frequency of eye exams is based on your age, health, family medical history, use of contact lenses, and other factors. Follow your health care provider's recommendations for frequency of eye exams.  Eat a healthy diet. Foods such as vegetables, fruits, whole grains, low-fat dairy products, and lean protein foods contain the nutrients you need without too many calories. Decrease your intake of foods high in solid fats, added sugars, and salt. Eat the right amount of calories for you.Get information about a proper diet from your health care provider, if necessary.  Regular physical exercise is one of the most important things you can do for your health. Most adults should get at least 150 minutes of moderate-intensity exercise (any activity that increases your heart rate and causes you to sweat) each week. In addition, most adults need muscle-strengthening exercises on 2 or more days a week.  Maintain a healthy weight. The body mass index (BMI) is a screening tool to identify possible weight problems. It provides an estimate of body fat based on height and weight. Your health  care provider can find your BMI and can help you achieve or maintain a healthy weight.For adults 20 years and older:  A BMI below 18.5 is considered underweight.  A BMI of 18.5 to 24.9 is normal.  A BMI of 25 to 29.9 is considered overweight.  A BMI of 30 and above is considered obese.  Maintain normal blood lipids and cholesterol levels by exercising and minimizing your intake of saturated fat. Eat a balanced diet with plenty of fruit and vegetables. Blood tests for lipids and cholesterol should begin at age 46 and be repeated every 5 years. If your lipid or cholesterol levels are high, you are over 50, or you are at high risk for heart disease, you may need your cholesterol levels checked more frequently.Ongoing high lipid and cholesterol levels should be treated with medicines if diet and exercise are not working.  If you smoke, find out from your health care provider how to quit. If you do not use tobacco, do not start.  Lung cancer screening is recommended for adults aged 58-80 years who are at high risk for developing lung cancer because of a history of smoking. A yearly low-dose CT scan of the lungs is recommended for people who have at least a 30-pack-year history of smoking and are a current smoker or have quit within the past 15 years. A pack year of smoking is smoking an average of 1 pack of cigarettes a day for 1 year (for example: 1 pack a day for 30 years or 2  packs a day for 15 years). Yearly screening should continue until the smoker has stopped smoking for at least 15 years. Yearly screening should be stopped for people who develop a health problem that would prevent them from having lung cancer treatment.  If you choose to drink alcohol, do not have more than 2 drinks per day. One drink is considered to be 12 ounces (355 mL) of beer, 5 ounces (148 mL) of wine, or 1.5 ounces (44 mL) of liquor.  Avoid use of street drugs. Do not share needles with anyone. Ask for help if you need  support or instructions about stopping the use of drugs.  High blood pressure causes heart disease and increases the risk of stroke. Your blood pressure should be checked at least every 1-2 years. Ongoing high blood pressure should be treated with medicines, if weight loss and exercise are not effective.  If you are 79-25 years old, ask your health care provider if you should take aspirin to prevent heart disease.  Diabetes screening is done by taking a blood sample to check your blood glucose level after you have not eaten for a certain period of time (fasting). If you are not overweight and you do not have risk factors for diabetes, you should be screened once every 3 years starting at age 29. If you are overweight or obese and you are 77-75 years of age, you should be screened for diabetes every year as part of your cardiovascular risk assessment.  Colorectal cancer can be detected and often prevented. Most routine colorectal cancer screening begins at the age of 46 and continues through age 39. However, your health care provider may recommend screening at an earlier age if you have risk factors for colon cancer. On a yearly basis, your health care provider may provide home test kits to check for hidden blood in the stool. Use of a small camera at the end of a tube to directly examine the colon (sigmoidoscopy or colonoscopy) can detect the earliest forms of colorectal cancer. Talk to your health care provider about this at age 61, when routine screening begins. Direct exam of the colon should be repeated every 5-10 years through age 20, unless early forms of precancerous polyps or small growths are found.  People who are at an increased risk for hepatitis B should be screened for this virus. You are considered at high risk for hepatitis B if:  You were born in a country where hepatitis B occurs often. Talk with your health care provider about which countries are considered high risk.  Your parents  were born in a high-risk country and you have not received a shot to protect against hepatitis B (hepatitis B vaccine).  You have HIV or AIDS.  You use needles to inject street drugs.  You live with, or have sex with, someone who has hepatitis B.  You are a man who has sex with other men (MSM).  You get hemodialysis treatment.  You take certain medicines for conditions such as cancer, organ transplantation, and autoimmune conditions.  Hepatitis C blood testing is recommended for all people born from [birth year] through 1965 and any individual with known risks for hepatitis C.  Practice safe sex. Use condoms and avoid high-risk sexual practices to reduce the spread of sexually transmitted infections (STIs). STIs include gonorrhea, chlamydia, syphilis, trichomonas, herpes, HPV, and human immunodeficiency virus (HIV). Herpes, HIV, and HPV are viral illnesses that have no cure. They can result in disability, cancer, and death.  If you are a man who has sex with other men, you should be screened at least once per year for:  HIV.  Urethral, rectal, and pharyngeal infection of gonorrhea, chlamydia, or both.  If you are at risk of being infected with HIV, it is recommended that you take a prescription medicine daily to prevent HIV infection. This is called preexposure prophylaxis (PrEP). You are considered at risk if:  You are a man who has sex with other men (MSM) and have other risk factors.  You are a heterosexual man, are sexually active, and are at increased risk for HIV infection.  You take drugs by injection.  You are sexually active with a partner who has HIV.  Talk with your health care provider about whether you are at high risk of being infected with HIV. If you choose to begin PrEP, you should first be tested for HIV. You should then be tested every 3 months for as long as you are taking PrEP.  A one-time screening for abdominal aortic aneurysm (AAA) and surgical repair of large AAAs  by ultrasound are recommended for men ages 40 to 56 years who are current or former smokers.  Healthy men should no longer receive prostate-specific antigen (PSA) blood tests as part of routine cancer screening. Talk with your health care provider about prostate cancer screening.  Testicular cancer screening is not recommended for adult males who have no symptoms. Screening includes self-exam, a health care provider exam, and other screening tests. Consult with your health care provider about any symptoms you have or any concerns you have about testicular cancer.  Use sunscreen. Apply sunscreen liberally and repeatedly throughout the day. You should seek shade when your shadow is shorter than you. Protect yourself by wearing long sleeves, pants, a wide-brimmed hat, and sunglasses year round, whenever you are outdoors.  Once a month, do a whole-body skin exam, using a mirror to look at the skin on your back. Tell your health care provider about new moles, moles that have irregular borders, moles that are larger than a pencil eraser, or moles that have changed in shape or color.  Stay current with required vaccines (immunizations).  Influenza vaccine. All adults should be immunized every year.  Tetanus, diphtheria, and acellular pertussis (Td, Tdap) vaccine. An adult who has not previously received Tdap or who does not know his vaccine status should receive 1 dose of Tdap. This initial dose should be followed by tetanus and diphtheria toxoids (Td) booster doses every 10 years. Adults with an unknown or incomplete history of completing a 3-dose immunization series with Td-containing vaccines should begin or complete a primary immunization series including a Tdap dose. Adults should receive a Td booster every 10 years.  Varicella vaccine. An adult without evidence of immunity to varicella should receive 2 doses or a second dose if he has previously received 1 dose.  Human papillomavirus (HPV) vaccine.  Males aged 11-21 years who have not received the vaccine previously should receive the 3-dose series. Males aged 22-26 years may be immunized. Immunization is recommended through the age of 66 years for any male who has sex with males and did not get any or all doses earlier. Immunization is recommended for any person with an immunocompromised condition through the age of 14 years if he did not get any or all doses earlier. During the 3-dose series, the second dose should be obtained 4-8 weeks after the first dose. The third dose should be obtained 24 weeks after  the first dose and 16 weeks after the second dose.  Zoster vaccine. One dose is recommended for adults aged 24 years or older unless certain conditions are present.  Measles, mumps, and rubella (MMR) vaccine. Adults born before [birth year] generally are considered immune to measles and mumps. Adults born in [birth year] or later should have 1 or more doses of MMR vaccine unless there is a contraindication to the vaccine or there is laboratory evidence of immunity to each of the three diseases. A routine second dose of MMR vaccine should be obtained at least 28 days after the first dose for students attending postsecondary schools, health care workers, or international travelers. People who received inactivated measles vaccine or an unknown type of measles vaccine during 1963-1967 should receive 2 doses of MMR vaccine. People who received inactivated mumps vaccine or an unknown type of mumps vaccine before 1979 and are at high risk for mumps infection should consider immunization with 2 doses of MMR vaccine. Unvaccinated health care workers born before [birth year] who lack laboratory evidence of measles, mumps, or rubella immunity or laboratory confirmation of disease should consider measles and mumps immunization with 2 doses of MMR vaccine or rubella immunization with 1 dose of MMR vaccine.  Pneumococcal 13-valent conjugate (PCV13) vaccine. When indicated, a person who is  uncertain of his immunization history and has no record of immunization should receive the PCV13 vaccine. All adults 33 years of age and older should receive this vaccine. An adult aged 38 years or older who has certain medical conditions and has not been previously immunized should receive 1 dose of PCV13 vaccine. This PCV13 should be followed with a dose of pneumococcal polysaccharide (PPSV23) vaccine. Adults who are at high risk for pneumococcal disease should obtain the PPSV23 vaccine at least 8 weeks after the dose of PCV13 vaccine. Adults older than 60 years of age who have normal immune system function should obtain the PPSV23 vaccine dose at least 1 year after the dose of PCV13 vaccine.  Pneumococcal polysaccharide (PPSV23) vaccine. When PCV13 is also indicated, PCV13 should be obtained first. All adults aged 32 years and older should be immunized. An adult younger than age 47 years who has certain medical conditions should be immunized. Any person who resides in a nursing home or long-term care facility should be immunized. An adult smoker should be immunized. People with an immunocompromised condition and certain other conditions should receive both PCV13 and PPSV23 vaccines. People with human immunodeficiency virus (HIV) infection should be immunized as soon as possible after diagnosis. Immunization during chemotherapy or radiation therapy should be avoided. Routine use of PPSV23 vaccine is not recommended for American Indians, [place] Natives, or people younger than 65 years unless there are medical conditions that require PPSV23 vaccine. When indicated, people who have unknown immunization and have no record of immunization should receive PPSV23 vaccine. One-time revaccination 5 years after the first dose of PPSV23 is recommended for people aged 19-64 years who have chronic kidney failure, nephrotic syndrome, asplenia, or immunocompromised conditions. People who received 1-2 doses of PPSV23 before age  37 years should receive another dose of PPSV23 vaccine at age 58 years or later if at least 5 years have passed since the previous dose. Doses of PPSV23 are not needed for people immunized with PPSV23 at or after age 54 years.  Meningococcal vaccine. Adults with asplenia or persistent complement component deficiencies should receive 2 doses of quadrivalent meningococcal conjugate (MenACWY-D) vaccine. The doses should be obtained at least 2 months  apart. Microbiologists working with certain meningococcal bacteria, [place] recruits, people at risk during an outbreak, and people who travel to or live in countries with a high rate of meningitis should be immunized. A first-year college student up through age 20 years who is living in a residence hall should receive a dose if he did not receive a dose on or after his 16th birthday. Adults who have certain high-risk conditions should receive one or more doses of vaccine.  Hepatitis A vaccine. Adults who wish to be protected from this disease, have chronic liver disease, work with hepatitis A-infected animals, work in hepatitis A research labs, or travel to or work in countries with a high rate of hepatitis A should be immunized. Adults who were previously unvaccinated and who anticipate close contact with an international adoptee during the first 60 days after arrival in the [place] from a country with a high rate of hepatitis A should be immunized.  Hepatitis B vaccine. Adults should be immunized if they wish to be protected from this disease, are under age 5 years and have diabetes, have chronic liver disease, have had more than one sex partner in the past 6 months, may be exposed to blood or other infectious body fluids, are household contacts or sex partners of hepatitis B positive people, are clients or workers in certain care facilities, or travel to or work in countries with a high rate of hepatitis B.  Haemophilus influenzae type b (Hib) vaccine. A  previously unvaccinated person with asplenia or sickle cell disease or having a scheduled splenectomy should receive 1 dose of Hib vaccine. Regardless of previous immunization, a recipient of a hematopoietic stem cell transplant should receive a 3-dose series 6-12 months after his successful transplant. Hib vaccine is not recommended for adults with HIV infection. Preventive Service / Frequency Ages 39 to 73  Blood pressure check.** / Every 3-5 years.  Lipid and cholesterol check.** / Every 5 years beginning at age 69.  Hepatitis C blood test.** / For any individual with known risks for hepatitis C.  Skin self-exam. / Monthly.  Influenza vaccine. / Every year.  Tetanus, diphtheria, and acellular pertussis (Tdap, Td) vaccine.** / Consult your health care provider. 1 dose of Td every 10 years.  Varicella vaccine.** / Consult your health care provider.  HPV vaccine. / 3 doses over 6 months, if 26 or younger.  Measles, mumps, rubella (MMR) vaccine.** / You need at least 1 dose of MMR if you were born in 1957 or later. You may also need a second dose.  Pneumococcal 13-valent conjugate (PCV13) vaccine.** / Consult your health care provider.  Pneumococcal polysaccharide (PPSV23) vaccine.** / 1 to 2 doses if you smoke cigarettes or if you have certain conditions.  Meningococcal vaccine.** / 1 dose if you are age 79 to 46 years and a Market researcher living in a residence hall, or have one of several medical conditions. You may also need additional booster doses.  Hepatitis A vaccine.** / Consult your health care provider.  Hepatitis B vaccine.** / Consult your health care provider.  Haemophilus influenzae type b (Hib) vaccine.** / Consult your health care provider. Ages 106 to 33  Blood pressure check.** / Every year.  Lipid and cholesterol check.** / Every 5 years beginning at age 88.  Lung cancer screening. / Every year if you are aged 62-80 years and have a 30-pack-year  history of smoking and currently smoke or have quit within the past 15 years. Yearly screening  is stopped once you have quit smoking for at least 15 years or develop a health problem that would prevent you from having lung cancer treatment.  Fecal occult blood test (FOBT) of stool. / Every year beginning at age 5 and continuing until age 16. You may not have to do this test if you get a colonoscopy every 10 years.  Flexible sigmoidoscopy** or colonoscopy.** / Every 5 years for a flexible sigmoidoscopy or every 10 years for a colonoscopy beginning at age 60 and continuing until age 41.  Hepatitis C blood test.** / For all people born from [birth year] through 1965 and any individual with known risks for hepatitis C.  Skin self-exam. / Monthly.  Influenza vaccine. / Every year.  Tetanus, diphtheria, and acellular pertussis (Tdap/Td) vaccine.** / Consult your health care provider. 1 dose of Td every 10 years.  Varicella vaccine.** / Consult your health care provider.  Zoster vaccine.** / 1 dose for adults aged 62 years or older.  Measles, mumps, rubella (MMR) vaccine.** / You need at least 1 dose of MMR if you were born in 1957 or later. You may also need a second dose.  Pneumococcal 13-valent conjugate (PCV13) vaccine.** / Consult your health care provider.  Pneumococcal polysaccharide (PPSV23) vaccine.** / 1 to 2 doses if you smoke cigarettes or if you have certain conditions.  Meningococcal vaccine.** / Consult your health care provider.  Hepatitis A vaccine.** / Consult your health care provider.  Hepatitis B vaccine.** / Consult your health care provider.  Haemophilus influenzae type b (Hib) vaccine.** / Consult your health care provider. Ages 82 and over  Blood pressure check.** / Every year.  Lipid and cholesterol check.**/ Every 5 years beginning at age 52.  Lung cancer screening. / Every year if you are aged 12-80 years and have a 30-pack-year history of smoking and currently  smoke or have quit within the past 15 years. Yearly screening is stopped once you have quit smoking for at least 15 years or develop a health problem that would prevent you from having lung cancer treatment.  Fecal occult blood test (FOBT) of stool. / Every year beginning at age 43 and continuing until age 47. You may not have to do this test if you get a colonoscopy every 10 years.  Flexible sigmoidoscopy** or colonoscopy.** / Every 5 years for a flexible sigmoidoscopy or every 10 years for a colonoscopy beginning at age 65 and continuing until age 49.  Hepatitis C blood test.** / For all people born from [birth year] through 1964 and any individual with known risks for hepatitis C.  Abdominal aortic aneurysm (AAA) screening.** / A one-time screening for ages 5 to 58 years who are current or former smokers.  Skin self-exam. / Monthly.  Influenza vaccine. / Every year.  Tetanus, diphtheria, and acellular pertussis (Tdap/Td) vaccine.** / 1 dose of Td every 10 years.  Varicella vaccine.** / Consult your health care provider.  Zoster vaccine.** / 1 dose for adults aged 5 years or older.  Pneumococcal 13-valent conjugate (PCV13) vaccine.** / 1 dose for all adults aged 26 years and older.  Pneumococcal polysaccharide (PPSV23) vaccine.** / 1 dose for all adults aged 6 years and older.  Meningococcal vaccine.** / Consult your health care provider.  Hepatitis A vaccine.** / Consult your health care provider.  Hepatitis B vaccine.** / Consult your health care provider.  Haemophilus influenzae type b (Hib) vaccine.** / Consult your health care provider. **Family history and personal history of risk and conditions may change  your health care provider's recommendations.   This information is not intended to replace advice given to you by your health care provider. Make sure you discuss any questions you have with your health care provider.   Document Released: 08/23/2001 Document Revised:  07/18/2014 Document Reviewed: 11/22/2010 Elsevier Interactive Patient Education [employer].

## 2017-01-31 NOTE — Assessment & Plan Note (Signed)
DRE thru urology yrly or per their recs

## 2017-01-31 NOTE — Progress Notes (Signed)
Male physical  Impression and Recommendations:    1. Screening for multiple conditions   2. Encounter for wellness examination   3. Health education/counseling   4. History of tobacco abuse   5. Stopped smoking less then 5 yrs ago, with greater than 30 pack year history   6. Family history of prostate cancer in father   31. Cigar smoker   8. Prediabetes   9. mild fatigue     Orders Placed This Encounter  Procedures   CT CHEST LUNG CA SCREEN LOW DOSE W/O CM    Standing Status:   Future    Standing Expiration Date:   05/03/2017    Order Specific Question:   Reason for Exam (SYMPTOM  OR DIAGNOSIS REQUIRED)    Answer:   lung ca screening due to sign h/o 30pk yrs and only quit 5 yrs or less ago    Order Specific Question:   Preferred Imaging Location?    Answer:   [facility]   Comprehensive metabolic panel    Standing Status:   Future    Standing Expiration Date:   01/31/2018   TSH    Standing Status:   Future    Standing Expiration Date:   01/31/2018   VITAMIN D 25 Hydroxy (Vit-D Deficiency, Fractures)    Standing Status:   Future    Standing Expiration Date:   01/31/2018   Lipid panel    Standing Status:   Future    Standing Expiration Date:   01/31/2018   Hemoglobin A1c    Standing Status:   Future    Standing Expiration Date:   01/31/2018   CBC with Differential/Platelet    Standing Status:   Future    Standing Expiration Date:   01/31/2018     Family history of prostate cancer in father DRE thru urology yrly or per their recs    Patient's Medications  New Prescriptions   No medications on file  Previous Medications   ASPIRIN 81 PO    Take by mouth.   GINGER, ZINGIBER OFFICINALIS, (GINGER PO)    Take by mouth.   GLUCOSAMINE-CHONDROITIN 500-400 MG TABLET    Take 1 tablet by mouth 2 (two) times daily.   MISC NATURAL PRODUCTS (BLACK CHERRY CONCENTRATE PO)    Take by mouth.   NON FORMULARY    Tumeric   OMEGA-3 FATTY ACIDS (FISH OIL PO)    Take by mouth.    VALACYCLOVIR HCL (VALTREX PO)    Take by mouth. Patient taking his wifes medication  Modified Medications   No medications on file  Discontinued Medications   No medications on file     Please see AVS handed out to patient at the end of our visit for further patient instructions/ counseling done pertaining to today's office visit.  1) Anticipatory Guidance: Discussed importance of wearing a seatbelt while driving, not texting while driving;   sunscreen when outside along with skin surveillance; eating a balanced and modest diet; physical activity at least 25 minutes per day or 150 min/ week moderate to intense activity.  2) Immunizations / Screenings / Labs:   All immunizations are up-to-date per recommendations or will be updated today. Patient is due for dental and vision screens which pt will schedule independently. Will obtain CBC, CMP, HgA1c, Lipid panel, TSH and vit D when fasting, if not already done recently.   3) Weight:  BMI meaning discussed with patient.  Discussed goal of losing 5-10% of current  body weight which would improve overall feelings of well being and improve objective health data. Improve nutrient density of diet through increasing intake of fruits and vegetables and decreasing saturated fats, white flour products and refined sugars.    Gross side effects, risk and benefits, and alternatives of medications discussed with patient.  Patient is aware that all medications have potential side effects and we are unable to predict every side effect or drug-drug interaction that may occur.  Expresses verbal understanding and consents to current therapy plan and treatment regimen.  Follow-up preventative CPE in 1 year. Follow-up office visit pending lab work.  F/up sooner for chronic care management and/or prn    Subjective:    CC: CPE  HPI: Jose Hobbs is a 61 y.o. male who presents to [facility] today for a yearly health maintenance exam.      Health Maintenance Summary Reviewed and updated, unless pt declines services.  Aspirin: administering 81 mg daily Colonoscopy:   3/15--> 1 benign polyp-- Dr Loreta Ave- repeat 5-10 yrs Tdap: Up to date: needs TD 5-10 yrs.  Pneumovax/PPSV23:  Postponed- will check with med hx and insurance co Prevnar 13/PCV13:    Postponed- will check med hx and with insurance co Zostavax:    Postponed- will check with insurance co Tobacco History Reviewed:   Yes---. Significant CT scan for screening lung CA:   None yet- ordered Abdominal Ultrasound:     ( Unnecessary secondary to < 65 or > 74 years old) Alcohol:    No concerns, no excessive use Exercise Habits:   Not meeting AHA guidleines STD concerns:   none Drug Use:   None Birth control method:   n/a Testicular/penile concerns:    no Dilated eye exam last week. Dental q 58mo   Health Maintenance  Topic Date Due   Hepatitis C Screening  02/07/2017 (Originally 12/05/1955)   HIV Screening  02/07/2017 (Originally 01/12/1971)   INFLUENZA VACCINE  02/08/2017   COLONOSCOPY  10/01/2023   TETANUS/TDAP  10/07/2024      Wt Readings from Last 3 Encounters:  01/31/17 182 lb (82.6 kg)  01/27/16 186 lb (84.4 kg)  01/21/16 184 lb 6.4 oz (83.6 kg)   BP Readings from Last 3 Encounters:  01/31/17 (!) 153/85  01/27/16 (!) 142/80  01/21/16 131/81   Pulse Readings from Last 3 Encounters:  01/31/17 (!) 59  01/27/16 61  01/21/16 (!) 54    Patient Active Problem List   Diagnosis Date Noted   Chronic back pain greater than 3 months duration 01/30/2016   Prediabetes 01/27/2016   Arthritis- lower back 01/27/2016   mild fatigue 01/21/2016   pt concerned /w mild Memory dysfunction 01/21/2016   History of tobacco abuse 01/21/2016   Cigar smoker 01/21/2016   Family history of prostate cancer in father 01/21/2016    Past Medical History:  Diagnosis Date   Arthritis     No past surgical history on file.  Family History  Problem Relation Age of Onset    Depression Mother    Hypertension Mother    Cancer Father        metastatic prostate   Depression Father    Hypertension Father    Stroke Father    Healthy Brother     History  Drug Use No  ,  History  Alcohol Use No  ,  History  Smoking Status   Former Smoker   Packs/day: 1.00   Years: 38.00   Quit  date: 07/12/2011  Smokeless Tobacco   Never Used  ,  History  Sexual Activity   Sexual activity: Yes   Birth control/ protection: None    Patient's Medications  New Prescriptions   No medications on file  Previous Medications   ASPIRIN 81 PO    Take by mouth.   GINGER, ZINGIBER OFFICINALIS, (GINGER PO)    Take by mouth.   GLUCOSAMINE-CHONDROITIN 500-400 MG TABLET    Take 1 tablet by mouth 2 (two) times daily.   MISC NATURAL PRODUCTS (BLACK CHERRY CONCENTRATE PO)    Take by mouth.   NON FORMULARY    Tumeric   OMEGA-3 FATTY ACIDS (FISH OIL PO)    Take by mouth.   VALACYCLOVIR HCL (VALTREX PO)    Take by mouth. Patient taking his wifes medication  Modified Medications   No medications on file  Discontinued Medications   No medications on file    Patient has no known allergies.  Review of Systems: General:   Denies fever, chills, unexplained weight loss.  Optho/Auditory:   Denies visual changes, blurred vision/LOV Respiratory:   Denies SOB, DOE more than baseline levels.   Cardiovascular:   Denies chest pain, palpitations, new onset peripheral edema  Gastrointestinal:   Denies nausea, vomiting, diarrhea.  Genitourinary: Denies dysuria, freq/ urgency, flank pain or discharge from genitals.  Endocrine:     Denies hot or cold intolerance, polyuria, polydipsia. Musculoskeletal:   Denies unexplained myalgias, joint swelling, unexplained arthralgias, gait problems.  Skin:  Denies rash, suspicious lesions Neurological:     Denies dizziness, unexplained weakness, numbness  Psychiatric/Behavioral:   Denies mood changes, suicidal or homicidal ideations,  hallucinations    Objective:     Blood pressure (!) 153/85, pulse (!) 59, height 5\' 11"  (1.803 m), weight 182 lb (82.6 kg). Body mass index is 25.38 kg/m. General Appearance:    Alert, cooperative, no distress, appears stated age  Head:    Normocephalic, without obvious abnormality, atraumatic  Eyes:    PERRL, conjunctiva/corneas clear, EOM's intact, fundi    benign, both eyes  Ears:    Normal TM's and external ear canals, both ears  Nose:   Nares normal, septum midline, mucosa normal, no drainage    or sinus tenderness  Throat:   Lips w/o lesion, mucosa moist, and tongue normal; teeth and   gums normal  Neck:   Supple, symmetrical, trachea midline, no adenopathy;    thyroid:  no enlargement/tenderness/nodules; no carotid   bruit or JVD  Back:     Symmetric, no curvature, ROM normal, no CVA tenderness  Lungs:     Clear to auscultation bilaterally, respirations unlabored, no       Wh/ R/ R  Chest Wall:    No tenderness or gross deformity; normal excursion   Heart:    Regular rate and rhythm, S1 and S2 normal, no murmur, rub   or gallop  Abdomen:     Soft, non-tender, bowel sounds active all four quadrants, NO   G/R/R, no masses, no organomegaly  Genitalia:  Deferred- has Urologist  Rectal:    Extremities:   Extremities normal, atraumatic, no cyanosis or gross edema  Pulses:   2+ and symmetric all extremities  Skin:   Warm, dry, Skin color, texture, turgor normal, no obvious rashes or lesions  M-Sk:   Ambulates * 4 w/o difficulty, no gross deformities, tone WNL  Neurologic:   CNII-XII intact, normal strength, sensation and reflexes    Throughout Psych:  No  HI/SI, judgement and insight good, Euthymic mood. Full Affect.

## 2017-02-01 ENCOUNTER — Telehealth: Payer: Self-pay

## 2017-02-01 NOTE — Telephone Encounter (Signed)
-----   Message from Ramonita Lab sent at 01/31/2017  9:07 AM EDT ----- Regarding: CT Pre Auth Patient is sch on 02/07/17 at 9:00a with [facility], CT needs a PA before study.

## 2017-02-01 NOTE — Telephone Encounter (Signed)
Called [employer] for [place] P3790 precert.  Request is under review and needs provider to call.  Please call [phone], reference [id number].

## 2017-02-01 NOTE — Telephone Encounter (Signed)
I called and got this approved thru insurance co mid-day on 02/01/17 already

## 2017-02-07 ENCOUNTER — Ambulatory Visit
Admission: RE | Admit: 2017-02-07 | Discharge: 2017-02-07 | Disposition: A | Discharge status: Home / Self Care | Payer: BLUE CROSS/BLUE SHIELD | Source: Ambulatory Visit | Attending: Family Medicine | Admitting: Family Medicine

## 2017-02-07 ENCOUNTER — Other Ambulatory Visit (INDEPENDENT_AMBULATORY_CARE_PROVIDER_SITE_OTHER): Payer: BLUE CROSS/BLUE SHIELD

## 2017-02-07 DIAGNOSIS — Z87891 Personal history of nicotine dependence: Secondary | ICD-10-CM

## 2017-02-07 DIAGNOSIS — Z1211 Encounter for screening for malignant neoplasm of colon: Secondary | ICD-10-CM

## 2017-02-07 DIAGNOSIS — F17290 Nicotine dependence, other tobacco product, uncomplicated: Secondary | ICD-10-CM

## 2017-02-07 DIAGNOSIS — Z0000 Encounter for general adult medical examination without abnormal findings: Secondary | ICD-10-CM

## 2017-02-07 DIAGNOSIS — Z8042 Family history of malignant neoplasm of prostate: Secondary | ICD-10-CM

## 2017-02-07 LAB — IFOBT (OCCULT BLOOD)
IMMUNOLOGICAL FECAL OCCULT BLOOD TEST: NEGATIVE
IMMUNOLOGICAL FECAL OCCULT BLOOD TEST: NEGATIVE
IMMUNOLOGICAL FECAL OCCULT BLOOD TEST: NEGATIVE

## 2017-02-23 ENCOUNTER — Telehealth: Payer: Self-pay | Admitting: Family Medicine

## 2017-02-23 DIAGNOSIS — Z23 Encounter for immunization: Secondary | ICD-10-CM

## 2017-02-23 NOTE — Telephone Encounter (Signed)
Called patient left message to call the office. MPulliam, CMA/RT(R)  

## 2017-02-23 NOTE — Telephone Encounter (Signed)
Wife called on behalf of patient and has some questions about a few prescriptions they are looking for to be sent to [facility]

## 2017-02-24 NOTE — Telephone Encounter (Incomplete)
Patient needs RX sent over to [facility] for the Zoster Vaccine and the Pnemonuia Vaccine.  Thanks.

## 2017-02-26 NOTE — Telephone Encounter (Signed)
Ok to send

## 2017-02-27 MED ORDER — PNEUMOCOCCAL VAC POLYVALENT 25 MCG/0.5ML IJ INJ: 0.5000 mL | INJECTION | INTRAMUSCULAR | 0 refills | Status: AC

## 2017-02-27 MED ORDER — ZOSTER VAC RECOMB ADJUVANTED 50 MCG/0.5ML IM SUSR: 0.5000 mL | Freq: Once | INTRAMUSCULAR | 0 refills | Status: AC

## 2017-02-27 NOTE — Telephone Encounter (Signed)
RXs sent to pharmacy.  T. Kristen Bushway, CMA 

## 2017-02-27 NOTE — Addendum Note (Signed)
Addended by: Fonnie Mu on: 02/27/2017 11:36 AM   Modules accepted: Orders

## 2017-03-08 ENCOUNTER — Ambulatory Visit: Payer: BLUE CROSS/BLUE SHIELD | Admitting: Family Medicine

## 2017-03-15 ENCOUNTER — Encounter: Payer: Self-pay | Admitting: Family Medicine

## 2017-03-15 ENCOUNTER — Telehealth: Payer: Self-pay | Admitting: Family Medicine

## 2017-03-15 ENCOUNTER — Ambulatory Visit (INDEPENDENT_AMBULATORY_CARE_PROVIDER_SITE_OTHER): Payer: BLUE CROSS/BLUE SHIELD | Admitting: Family Medicine

## 2017-03-15 VITALS — BP 124/78 | HR 61 | Ht 71.0 in | Wt 182.4 lb

## 2017-03-15 DIAGNOSIS — G8929 Other chronic pain: Secondary | ICD-10-CM

## 2017-03-15 DIAGNOSIS — G5792 Unspecified mononeuropathy of left lower limb: Secondary | ICD-10-CM

## 2017-03-15 DIAGNOSIS — R5383 Other fatigue: Secondary | ICD-10-CM

## 2017-03-15 DIAGNOSIS — F4320 Adjustment disorder, unspecified: Secondary | ICD-10-CM

## 2017-03-15 DIAGNOSIS — R7303 Prediabetes: Secondary | ICD-10-CM

## 2017-03-15 DIAGNOSIS — Z87891 Personal history of nicotine dependence: Secondary | ICD-10-CM

## 2017-03-15 DIAGNOSIS — Z719 Counseling, unspecified: Secondary | ICD-10-CM

## 2017-03-15 DIAGNOSIS — M549 Dorsalgia, unspecified: Secondary | ICD-10-CM

## 2017-03-15 DIAGNOSIS — Z8042 Family history of malignant neoplasm of prostate: Secondary | ICD-10-CM

## 2017-03-15 DIAGNOSIS — M7989 Other specified soft tissue disorders: Secondary | ICD-10-CM | POA: Insufficient documentation

## 2017-03-15 MED ORDER — FLUOXETINE HCL 20 MG PO TABS: 20.0000 mg | ORAL_TABLET | Freq: Every day | ORAL | 3 refills | Status: DC

## 2017-03-15 MED ORDER — FLUOXETINE HCL 20 MG PO CAPS: 20.0000 mg | ORAL_CAPSULE | Freq: Every day | ORAL | 3 refills | Status: DC

## 2017-03-15 NOTE — Patient Instructions (Addendum)
Take one half tablet of the fluoxetine daily for 1 week then if well tolerated go to 1 tablet daily.  I will see you back in 6 weeks or so to reassess  4 your left thumb try to avoid any repetitive motion or trauma to the area.  Please use the thumb spica splints to protect it whenever it is in pain or bothering you.  Whenever you notice swelling in that thenar eminence of the thumb please ice it 15-20 minutes 4-5 times a day.  Ice is always what you turn to if you're having pain or stiffness in there.

## 2017-03-15 NOTE — Telephone Encounter (Signed)
[facility] rep states Capsules of Prozac are on $8 per Rx while, tablets are $165 per prescription-- Pls cll pharmacy to give approval to switch. --glh

## 2017-03-15 NOTE — Progress Notes (Signed)
Impression and Recommendations:    1. Adjustment disorder, unspecified type   2. Swelling of thumb, left   3. Neuropathy of left lower extremity   4. Chronic back pain greater than 3 months duration   5. History of tobacco abuse   6. Stopped smoking less then 5 yrs ago, with greater than 30 pack year history   7. Health education/counseling   8. Family history of prostate cancer in father   31. mild fatigue   10. Prediabetes      No problem-specific Assessment & Plan notes found for this encounter.   The patient was counseled, risk factors were discussed, anticipatory guidance given.   New Prescriptions   FLUOXETINE (PROZAC) 20 MG TABLET    Take 1 tablet (20 mg total) by mouth daily.     Discontinued Medications   No medications on file     Modified Medications   No medications on file      No orders of the defined types were placed in this encounter.    Gross side effects, risk and benefits, and alternatives of medications and treatment plan in general discussed with patient.  Patient is aware that all medications have potential side effects and we are unable to predict every side effect or drug-drug interaction that may occur.   Patient will call with any questions prior to using medication if they have concerns.  Expresses verbal understanding and consents to current therapy and treatment regimen.  No barriers to understanding were identified.  Red flag symptoms and signs discussed in detail.  Patient expressed understanding regarding what to do in case of emergency\urgent symptoms  Please see AVS handed out to patient at the end of our visit for further patient instructions/ counseling done pertaining to today's office visit.   No Follow-up on file.     Note: This document was prepared using Dragon voice recognition software and may include unintentional dictation errors.  Jose Hobbs 11:04  AM --------------------------------------------------------------------------------------------------------------------------------------------------------------------------------------------------------------------------------------------    Subjective:    CC:  Chief Complaint  Patient presents with  . Follow-up    HPI: Jose Hobbs is a 61 y.o. male who presents to [place] at [facility] today for issues as discussed below.  Patient lost his father approximate 4 months ago and his family has gone under a lot of changes since has been very stressful for him.  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 - Patient has been on Wellbutrin in the past to quit smoking but it felt like he was stuck in a cave and couldn't get out.  Just didn't feel right possibly paranoia or claustrophobic but just did not feel well on it.  Patient also complains of some numbness-type sx ( not pain) in his bottom of his left foot.  This is been going on for 3 years now.  He did not have any insinuating event that caused it.  It never really bothered him enough to mention it in the past but he thought he mention it today.  It's in the L4-5 sensory distribution in the medial palmar surface.  He notices it sometimes during the day and or sometimes at night.  It comes and goes.  He is not sure what makes it worse or what makes it better.  He does not have any radicular symptoms.  he can sometimes feel a little cool but he has no claudication or any other symptoms.  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  She denies any back pain currently, denies sciatica etc. No motor dysfunction or problems moving foot.   Also complains of left thumb can feel like it goes to sleep easily if he is sitting in his  La-Z-Boy  chair at night as thumb flexes it feels like it goes numb.  His thenar eminence is also swollen today, which she did not notice prior.  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  He denies any trauma to the area  But does use his hands daily to do physical labor.  He denies joint pain per se, denies any locking or triggering of the finger.  He has full range of motion.    Problem  Adjustment Disorder  Neuropathy of Left Lower Extremity  Swelling of Thumb, Left  Stopped smoking less then 5 yrs ago, with greater than 30 pack year history  Health Education/Counseling     Wt Readings from Last 3 Encounters:  03/15/17 182 lb 6.4 oz (82.7 kg)  01/31/17 182 lb (82.6 kg)  01/27/16 186 lb (84.4 kg)   BP Readings from Last 3 Encounters:  03/15/17 124/78  01/31/17 (!) 152/85  01/27/16 (!) 142/80   Pulse Readings from Last 3 Encounters:  03/15/17 61  01/31/17 62  01/27/16 61   BMI Readings from Last 3 Encounters:  03/15/17 25.44 kg/m  01/31/17 25.38 kg/m  01/27/16 25.58 kg/m     Patient Care Team    Relationship Specialty Notifications Start End  Mellody Dance, DO PCP - General Family Medicine  01/21/16   Juanita Craver, MD Consulting Physician Gastroenterology  01/31/17   Danella Sensing, MD Consulting Physician Dermatology  01/31/17   Ardis Hughs, MD Attending Physician Urology  01/31/17      Patient Active Problem List   Diagnosis Date Noted  . Adjustment disorder 03/15/2017  . Neuropathy of left lower extremity 03/15/2017  . Swelling of thumb, left 03/15/2017  . Stopped smoking less then 5 yrs ago, with greater than 30 pack year history 03/15/2017  . Health education/counseling 03/15/2017  . Chronic back pain greater than 3 months duration 01/30/2016  . Prediabetes 01/27/2016  . Arthritis- lower back 01/27/2016  . mild fatigue 01/21/2016  . pt concerned /w mild Memory dysfunction 01/21/2016  . History of tobacco abuse 01/21/2016  . Cigar smoker 01/21/2016  .  Family history of prostate cancer in father 01/21/2016    Past Medical history, Surgical history, Family history, Social history, Allergies and Medications have been entered into the medical record, reviewed and changed as needed.    Current Meds  Medication Sig  . ASPIRIN 81 PO Take by mouth.  . Ginger, Zingiber officinalis, (GINGER PO) Take by mouth.  Marland Kitchen glucosamine-chondroitin 500-400 MG tablet Take 1 tablet by mouth 2 (two) times daily.  . Misc Natural Products (BLACK CHERRY CONCENTRATE PO) Take by mouth.  . NON FORMULARY Tumeric  . Omega-3 Fatty Acids (FISH OIL PO) Take by mouth.  . valACYclovir (VALTREX) 1000 MG tablet Take 1 tablet by mouth as needed. Patient taking his wifes medication     Allergies:  No Known Allergies   Review of Systems: General:   Denies fever, chills, unexplained weight loss.  Optho/Auditory:   Denies visual changes, blurred vision/LOV Respiratory:   Denies wheeze, DOE more  than baseline levels.  Cardiovascular:   Denies chest pain, palpitations, new onset peripheral edema  Gastrointestinal:   Denies nausea, vomiting, diarrhea, abd pain.  Genitourinary: Denies dysuria, freq/ urgency, flank pain or discharge from genitals.  Endocrine:     Denies hot or cold intolerance, polyuria, polydipsia. Musculoskeletal:   Denies unexplained myalgias, joint swelling, unexplained arthralgias, gait problems.  Skin:  Denies new onset rash, suspicious lesions Neurological:     Denies dizziness, unexplained weakness, numbness  Psychiatric/Behavioral:   Denies mood changes, suicidal or homicidal ideations, hallucinations    Objective:   Blood pressure 124/78, pulse 61, height 5\' 11"  (1.803 m), weight 182 lb 6.4 oz (82.7 kg). Body mass index is 25.44 kg/m. General:  Well Developed, well nourished, appropriate for stated age.  Neuro:  Alert and oriented,  extra-ocular muscles intact  HEENT:  Normocephalic, atraumatic, neck supple, no carotid bruits appreciated    Skin:  no gross rash, warm, pink. Cardiac:  RRR, S1 S2 Respiratory:  ECTA B/L and A/P, Not using accessory muscles, speaking in full sentences- unlabored. Vascular:  Ext warm, no cyanosis apprec.; cap RF less 2 sec. Psych:  No HI/SI, judgement and insight good, Euthymic mood. Full Affect.

## 2017-03-16 LAB — LIPID PANEL
CHOLESTEROL TOTAL: 160 mg/dL (ref 100–199)
Chol/HDL Ratio: 4.6 ratio (ref 0.0–5.0)
HDL: 35 mg/dL — ABNORMAL LOW (ref >39)
LDL Calculated: 99 mg/dL (ref 0–99)
Triglycerides: 132 mg/dL (ref 0–149)
VLDL CHOLESTEROL CAL: 26 mg/dL (ref 5–40)

## 2017-03-16 LAB — CBC WITH DIFFERENTIAL/PLATELET
BASOS: 1 %
Basophils Absolute: 0 10*3/uL (ref 0.0–0.2)
EOS (ABSOLUTE): 0.2 10*3/uL (ref 0.0–0.4)
Eos: 4 %
Hematocrit: 47.6 % (ref 37.5–51.0)
Hemoglobin: 16 g/dL (ref 13.0–17.7)
IMMATURE GRANULOCYTES: 0 %
Immature Grans (Abs): 0 10*3/uL (ref 0.0–0.1)
Lymphocytes Absolute: 1.7 10*3/uL (ref 0.7–3.1)
Lymphs: 32 %
MCH: 31.4 pg (ref 26.6–33.0)
MCHC: 33.6 g/dL (ref 31.5–35.7)
MCV: 94 fL (ref 79–97)
MONOS ABS: 0.6 10*3/uL (ref 0.1–0.9)
Monocytes: 12 %
NEUTROS PCT: 51 %
Neutrophils Absolute: 2.8 10*3/uL (ref 1.4–7.0)
PLATELETS: 250 10*3/uL (ref 150–379)
RBC: 5.09 x10E6/uL (ref 4.14–5.80)
RDW: 13.3 % (ref 12.3–15.4)
WBC: 5.3 10*3/uL (ref 3.4–10.8)

## 2017-03-16 LAB — COMPREHENSIVE METABOLIC PANEL
A/G RATIO: 2 (ref 1.2–2.2)
ALT: 26 IU/L (ref 0–44)
AST: 30 IU/L (ref 0–40)
Albumin: 4.5 g/dL (ref 3.6–4.8)
Alkaline Phosphatase: 86 IU/L (ref 39–117)
BUN/Creatinine Ratio: 15 (ref 10–24)
BUN: 13 mg/dL (ref 8–27)
Bilirubin Total: 0.3 mg/dL (ref 0.0–1.2)
CALCIUM: 9.3 mg/dL (ref 8.6–10.2)
CO2: 24 mmol/L (ref 20–29)
Chloride: 104 mmol/L (ref 96–106)
Creatinine, Ser: 0.89 mg/dL (ref 0.76–1.27)
GFR calc Af Amer: 107 mL/min/{1.73_m2} (ref >59)
GFR, EST NON AFRICAN AMERICAN: 93 mL/min/{1.73_m2} (ref >59)
GLUCOSE: 102 mg/dL — AB (ref 65–99)
Globulin, Total: 2.3 g/dL (ref 1.5–4.5)
POTASSIUM: 5.3 mmol/L — AB (ref 3.5–5.2)
Sodium: 139 mmol/L (ref 134–144)
Total Protein: 6.8 g/dL (ref 6.0–8.5)

## 2017-03-16 LAB — HEMOGLOBIN A1C
Est. average glucose Bld gHb Est-mCnc: 111 mg/dL
HEMOGLOBIN A1C: 5.5 % (ref 4.8–5.6)

## 2017-03-16 LAB — TSH: TSH: 0.853 u[IU]/mL (ref 0.450–4.500)

## 2017-03-16 LAB — VITAMIN D 25 HYDROXY (VIT D DEFICIENCY, FRACTURES): VIT D 25 HYDROXY: 39.4 ng/mL (ref 30.0–100.0)

## 2017-06-14 ENCOUNTER — Encounter: Payer: Self-pay | Admitting: Family Medicine

## 2018-04-11 ENCOUNTER — Encounter: Payer: Self-pay | Admitting: Adult Health

## 2018-04-11 ENCOUNTER — Other Ambulatory Visit: Payer: Self-pay

## 2018-04-11 ENCOUNTER — Ambulatory Visit (INDEPENDENT_AMBULATORY_CARE_PROVIDER_SITE_OTHER): Payer: BLUE CROSS/BLUE SHIELD | Admitting: Adult Health

## 2018-04-11 VITALS — BP 145/87 | HR 47 | Temp 98.7°F | Ht 71.0 in | Wt 193.1 lb

## 2018-04-11 DIAGNOSIS — Z122 Encounter for screening for malignant neoplasm of respiratory organs: Secondary | ICD-10-CM

## 2018-04-11 DIAGNOSIS — T161XXA Foreign body in right ear, initial encounter: Secondary | ICD-10-CM | POA: Insufficient documentation

## 2018-04-11 NOTE — Assessment & Plan Note (Signed)
Two black hairs noted in R Ear, minimal reduced hearing on R side noted R ear flushed Hairs removed- hearing returned to normal and sensation returned to normal

## 2018-04-11 NOTE — Progress Notes (Signed)
Subjective:    Patient ID: Jose Hobbs, male    DOB: December 10, 1955, 62 y.o.   MRN: [id number]  HPI:  Jose Hobbs presents with "clicking in R ear that worsens at night and when I walk" that has been present >3 weeks. He does report mild reduction in hearing R ear. He reports camping in [place] about 4 weeks ago, sleeping in hammock with bug net. He ha not been swimming in >3 weeks. He denies nasal drainage/cough/HA/fever/night sweats/change in vision. He denies CP/dyspnea/dizziness/HA/palpitations He does report mild reduction in hearing R ear He has hx of tinnitus  He denies hx of vertigo He denies ABX in last 90 days   Patient Care Team    Relationship Specialty Notifications Start End  Mellody Dance, DO PCP - General Family Medicine  01/21/16   Juanita Craver, MD Consulting Physician Gastroenterology  01/31/17   Danella Sensing, MD Consulting Physician Dermatology  01/31/17   Ardis Hughs, MD Attending Physician Urology  01/31/17     Patient Active Problem List   Diagnosis Date Noted  . Foreign body of right ear 04/11/2018  . Adjustment disorder 03/15/2017  . Neuropathy of left lower extremity 03/15/2017  . Swelling of thumb, left 03/15/2017  . Stopped smoking less then 5 yrs ago, with greater than 30 pack year history 03/15/2017  . Health education/counseling 03/15/2017  . Chronic back pain greater than 3 months duration 01/30/2016  . Prediabetes 01/27/2016  . Arthritis- lower back 01/27/2016  . mild fatigue 01/21/2016  . pt concerned /w mild Memory dysfunction 01/21/2016  . History of tobacco abuse 01/21/2016  . Cigar smoker 01/21/2016  . Family history of prostate cancer in father 01/21/2016     Past Medical History:  Diagnosis Date  . Arthritis      History reviewed. No pertinent surgical history.   Family History  Problem Relation Age of Onset  . Depression Mother   . Hypertension Mother   . Cancer Father        metastatic prostate  .  Depression Father   . Hypertension Father   . Stroke Father   . Healthy Brother      Social History   Substance and Sexual Activity  Drug Use No     Social History   Substance and Sexual Activity  Alcohol Use No  . Alcohol/week: 0.0 standard drinks     Social History   Tobacco Use  Smoking Status Former Smoker  . Packs/day: 1.00  . Years: 38.00  . Pack years: 38.00  . Last attempt to quit: 07/12/2011  . Years since quitting: 6.7  Smokeless Tobacco Never Used     Outpatient Encounter Medications as of 04/11/2018  Medication Sig  . ASPIRIN 81 PO Take by mouth.  . Ginger, Zingiber officinalis, (GINGER PO) Take by mouth.  . Misc Natural Products (BLACK CHERRY CONCENTRATE PO) Take by mouth.  . Multiple Vitamin (MULTIVITAMIN) tablet Take 1 tablet by mouth daily.  . NON FORMULARY Tumeric  . Omega-3 Fatty Acids (FISH OIL PO) Take by mouth.  . [DISCONTINUED] FLUoxetine (PROZAC) 20 MG capsule Take 1 capsule (20 mg total) by mouth daily.  . [DISCONTINUED] glucosamine-chondroitin 500-400 MG tablet Take 1 tablet by mouth 2 (two) times daily.  . [DISCONTINUED] valACYclovir (VALTREX) 1000 MG tablet Take 1 tablet by mouth as needed. Patient taking his wifes medication    No facility-administered encounter medications on file as of 04/11/2018.     Allergies: Patient has no known allergies.  Body mass index is 26.93 kg/m.  Blood pressure (!) 145/87, pulse (!) 47, temperature 98.7 F (37.1 C), temperature source Oral, height 5\' 11"  (1.803 m), weight 193 lb 1.6 oz (87.6 kg), SpO2 98 %.  Review of Systems  Constitutional: Negative for activity change, appetite change, chills, diaphoresis, fatigue, fever and unexpected weight change.  HENT: Positive for hearing loss and tinnitus. Negative for congestion, ear discharge, ear pain, facial swelling, postnasal drip, rhinorrhea, sinus pressure, sinus pain, sneezing, sore throat and voice change.   Respiratory: Negative for cough, chest  tightness, shortness of breath, wheezing and stridor.   Cardiovascular: Negative for chest pain, palpitations and leg swelling.  Neurological: Negative for dizziness and headaches.  Hematological: Does not bruise/bleed easily.  Psychiatric/Behavioral: Negative for sleep disturbance.       Objective:   Physical Exam  Constitutional: He appears well-developed and well-nourished. No distress.  HENT:  Head: Normocephalic and atraumatic.  Right Ear: External ear normal. No lacerations. No drainage, swelling or tenderness. No foreign bodies. Tympanic membrane is not erythematous and not bulging. Tympanic membrane mobility is normal. Decreased hearing is noted.  Left Ear: External ear normal. No tenderness. No foreign bodies. Tympanic membrane is not erythematous and not bulging. No decreased hearing is noted.  Nose: Nose normal.  Mouth/Throat: Oropharynx is clear and moist.  Two black hairs noted in R Ear, minimal reduced hearing on R side noted R ear flushed Hairs removed- hearing returned to normal and sensation returned to normal  Eyes: Pupils are equal, round, and reactive to light. Conjunctivae and EOM are normal.  Cardiovascular: Normal rate, regular rhythm, normal heart sounds and intact distal pulses.  No murmur heard. Pulmonary/Chest: Effort normal and breath sounds normal. No stridor. No respiratory distress. He has no wheezes. He has no rales. He exhibits no tenderness.  Skin: He is not diaphoretic.      Assessment & Plan:   1. Foreign body of right ear, initial encounter     Foreign body of right ear Two black hairs noted in R Ear, minimal reduced hearing on R side noted R ear flushed Hairs removed- hearing returned to normal and sensation returned to normal    FOLLOW-UP:  No follow-ups on file.

## 2018-04-11 NOTE — Patient Instructions (Signed)
Ear Foreign Body An ear foreign body is an object that is stuck in your ear. The object is usually stuck in the ear canal. What are the causes? In all ages of people, the most common foreign bodies are insects that enter the ear canal. It is common for young children to put objects into the ear canal. These may include pebbles, beads, parts of toys, and any other small objects that fit into the ear. In adults, objects such as cotton swabs may become lodged in the ear canal. What are the signs or symptoms? A foreign body in the ear may cause:  Pain.  Buzzing or roaring sounds.  Hearing loss.  Ear drainage or bleeding.  Nausea and vomiting.  A feeling that your ear is full.  How is this diagnosed? Your health care provider may be able to diagnose an ear foreign body based on the information that you provide, your symptoms, and a physical exam. Your health care provider may also perform tests, such as testing your hearing and your ear pressure, to check for infection or other problems that are caused by the foreign body in your ear. How is this treated? Treatment depends on what the foreign body is, the location of the foreign body in your ear, and whether or not the foreign body has injured any part of your inner ear. If the foreign body is visible to your health care provider, it may be possible to remove the foreign body using:  A tool, such as medical tweezers (forceps) or a suction tube (catheter).  Irrigation. This uses water to flush the foreign body out of your ear. This is used only if the foreign body is not likely to swell or enlarge when it is put in water.  If the foreign body is not visible or your health care provider was not able to remove the foreign body, you may be referred to a specialist for removal. You may also be prescribed antibiotic medicine or ear drops to prevent infection. If the foreign body has caused injury to other parts of your ear, you may need additional  treatment. Follow these instructions at home:  Keep all follow-up visits as directed by your health care provider. This is important.  Take medicines only as directed by your health care provider.  If you were prescribed an antibiotic medicine, finish it all even if you start to feel better. How is this prevented?  Keep small objects out of reach of young children. Tell children not to put anything in their ears.  Do not put anything in your ear, including cotton swabs, to clean your ears. Talk to your health care provider about how to clean your ears safely. Contact a health care provider if:  You have a headache.  Your have blood coming from your ear.  You have a fever.  You have increased pain or swelling of your ear.  Your hearing is reduced.  You have discharge coming from your ear. This information is not intended to replace advice given to you by your health care provider. Make sure you discuss any questions you have with your health care provider. Document Released: 06/24/2000 Document Revised: 11/06/2015 Document Reviewed: 02/10/2014 Elsevier Interactive Patient Education  2018 [employer].   Please call with any questions/concerns. NICE TO MEET YOU!

## 2018-04-19 ENCOUNTER — Ambulatory Visit
Admission: RE | Admit: 2018-04-19 | Discharge: 2018-04-19 | Disposition: A | Discharge status: Home / Self Care | Payer: BLUE CROSS/BLUE SHIELD | Source: Ambulatory Visit | Attending: Family Medicine | Admitting: Family Medicine

## 2018-04-19 DIAGNOSIS — Z122 Encounter for screening for malignant neoplasm of respiratory organs: Secondary | ICD-10-CM

## 2018-09-03 IMAGING — CT CT CHEST LUNG CANCER SCREENING LOW DOSE W/O CM
1 of 5 series · 15 of 40 positions shown, 19 images · non-contrast
Comparison: None

CLINICAL DATA: Thirty-four pack-year history. Former asymptomatic
smoker.

EXAM:
CT CHEST WITHOUT CONTRAST LOW-DOSE FOR LUNG CANCER SCREENING
TECHNIQUE: Multidetector CT imaging of the chest was performed following the
standard protocol without IV contrast.

[Series 3: lung windows · axial · 0.75mm/px · z∈[-340,-26]mm · 15 of 281 slices shown, 19 images]
[im 15/281  mediastinal]
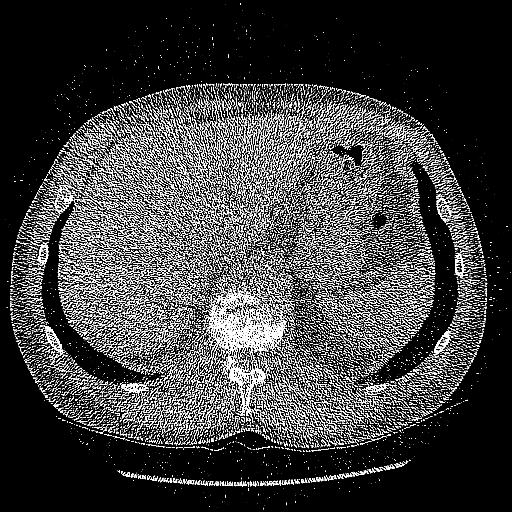
[im 15/281  lung]
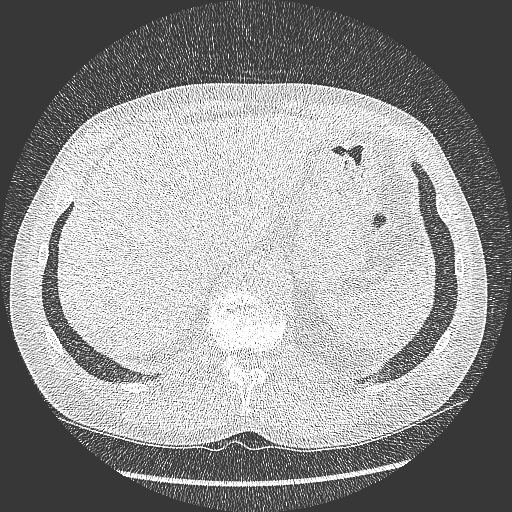
[im 30/281  lung]
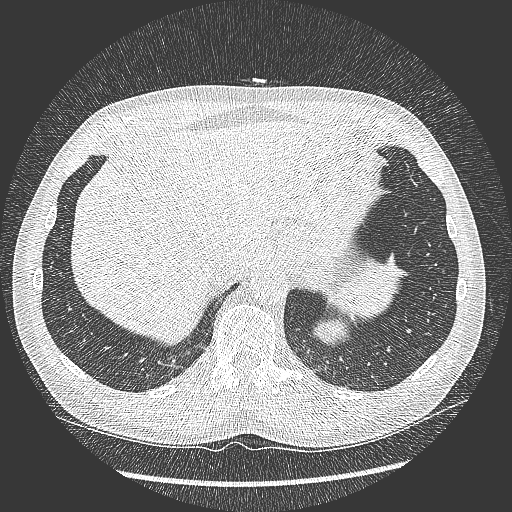
[im 59/281  lung]
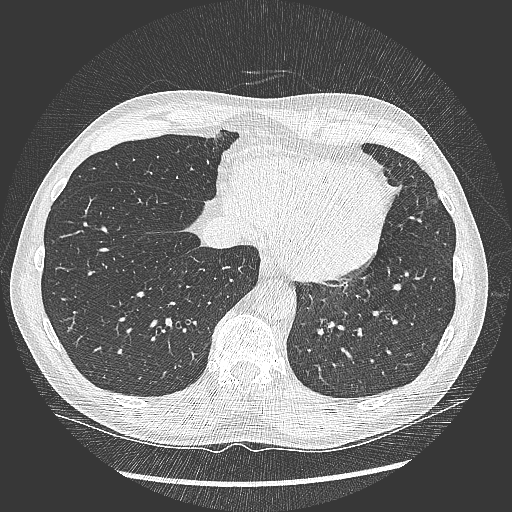
[im 74/281  lung]
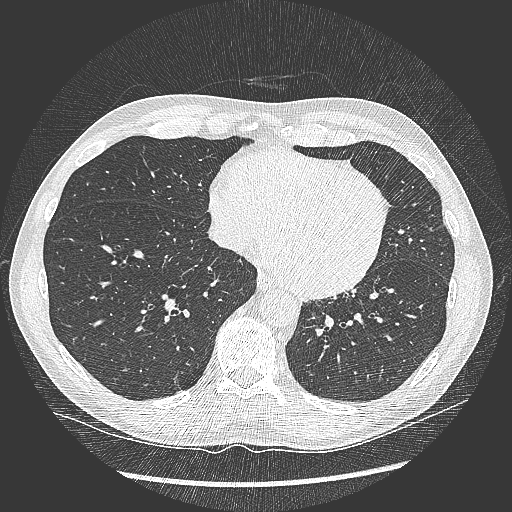
[im 89/281  mediastinal]
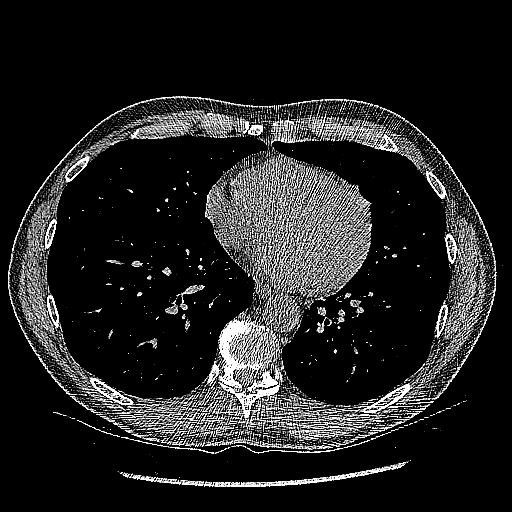
[im 89/281  lung]
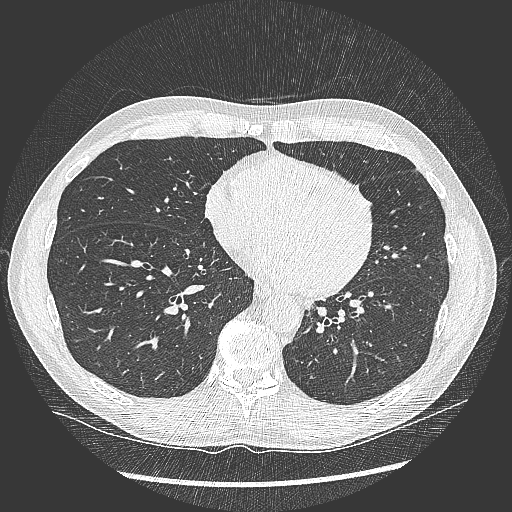
[im 104/281  lung]
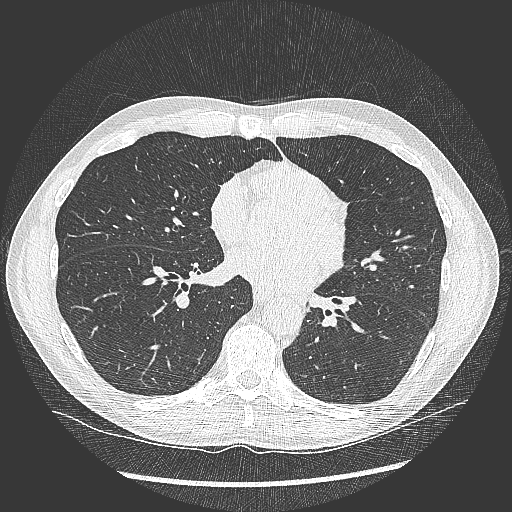
[im 118/281  lung]
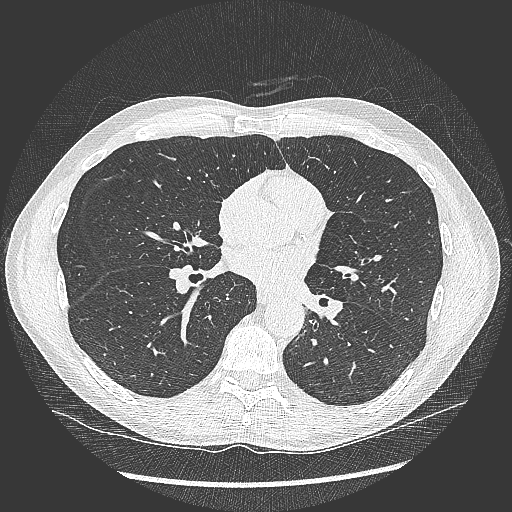
[im 148/281  lung]
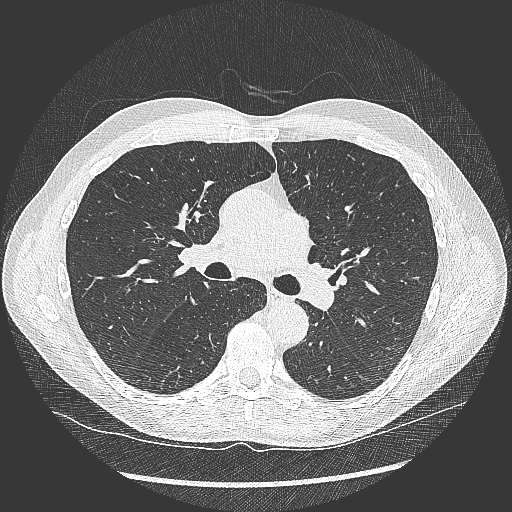
[im 163/281  mediastinal]
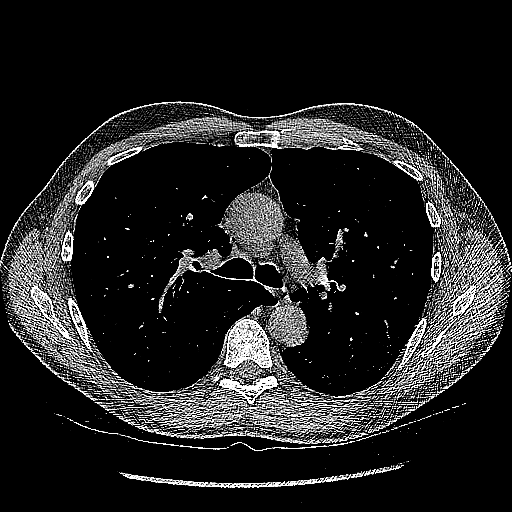
[im 163/281  lung]
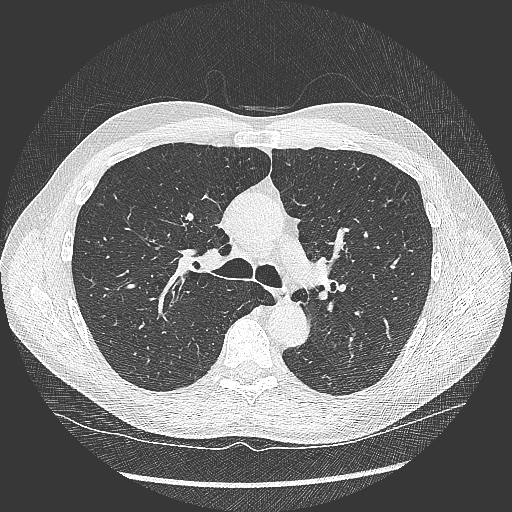
[im 177/281  lung]
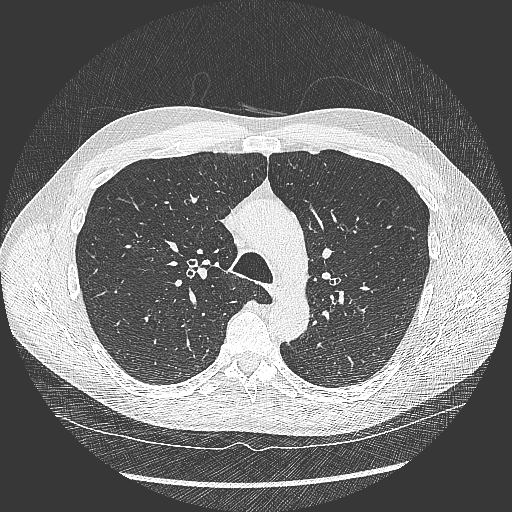
[im 192/281  lung]
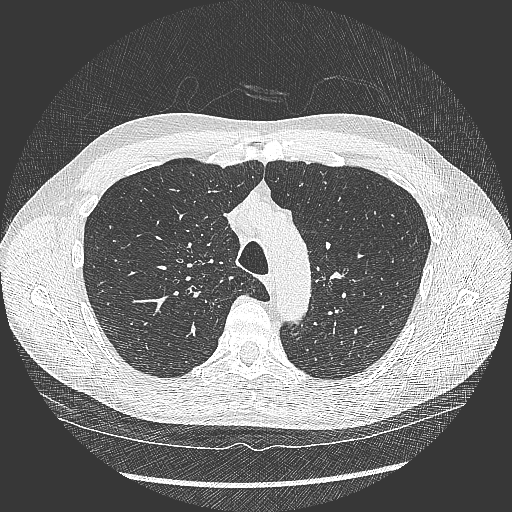
[im 207/281  lung]
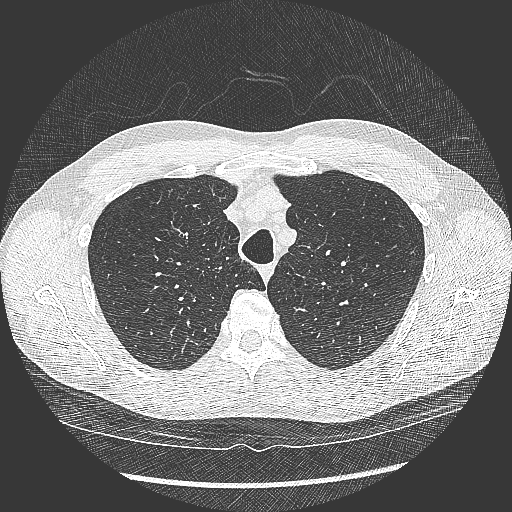
[im 236/281  mediastinal]
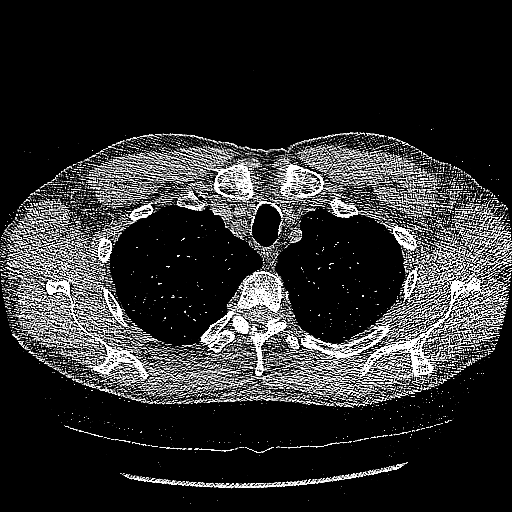
[im 236/281  lung]
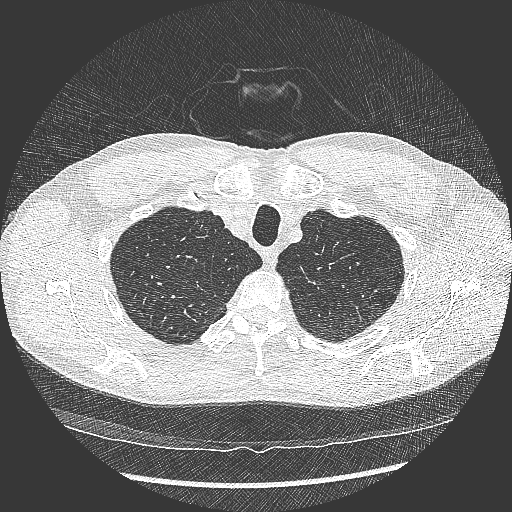
[im 251/281  lung]
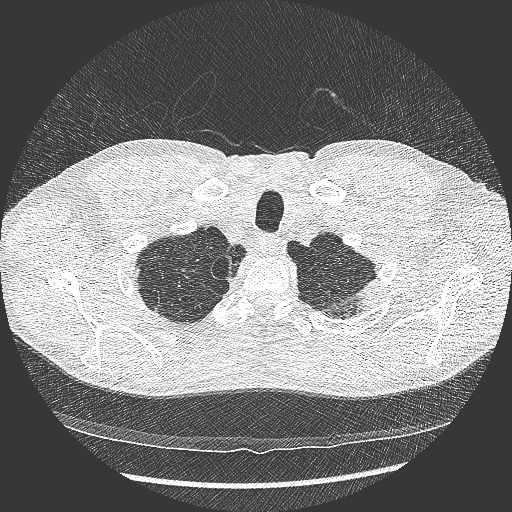
[im 266/281  lung]
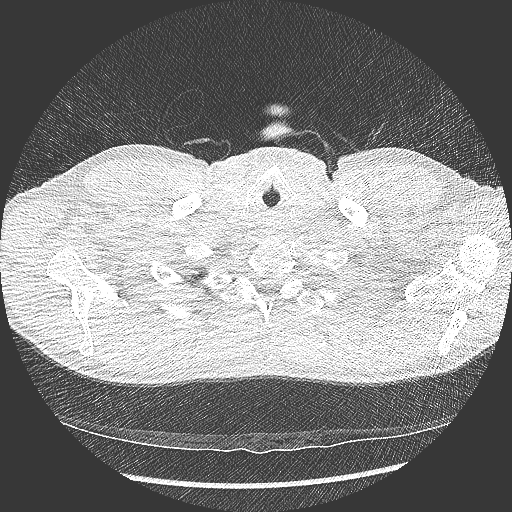

[15 of 40 positions shown; findings below may reference images not displayed]

FINDINGS: Cardiovascular: Normal heart size. No pericardial effusion.
Calcifications are noted in the RCA, LAD and left circumflex
coronary artery. Aortic atherosclerosis.

Mediastinum/Nodes: No enlarged mediastinal, hilar, or axillary lymph
nodes. Thyroid gland, trachea, and esophagus demonstrate no
significant findings.

Lungs/Pleura: Moderate changes of centrilobular and paraseptal
emphysema. Diffuse bronchial wall thickening identified. Small right
upper lobe perifissural nodule has an equivalent diameter of 2.7 mm
peer

Upper Abdomen: No acute abnormality.

Musculoskeletal: Degenerative disc disease is identified. No
aggressive lytic or sclerotic bone lesion.
IMPRESSION: 1. Lung-RADS 2, benign appearance or behavior. Continue annual
screening with low-dose chest CT without contrast in 12 months.
2. Aortic Atherosclerosis (ZG9IO-WFY.Y) and Emphysema ([id number]-7C8.E).
Multi vessel coronary artery calcifications noted.
